# Patient Record
Sex: Female | Born: 1970
Health system: Southern US, Community
[De-identification: ages and names within clinical notes are randomized; demographics above are authoritative.]

## PROBLEM LIST (undated history)

## (undated) DIAGNOSIS — I10 Essential (primary) hypertension: Secondary | ICD-10-CM

## (undated) HISTORY — DX: Essential (primary) hypertension: I10

---

## 2005-05-27 HISTORY — PX: OTHER SURGICAL HISTORY: SHX169

## 2009-08-18 DIAGNOSIS — M25559 Pain in unspecified hip: Secondary | ICD-10-CM | POA: Insufficient documentation

## 2009-08-18 DIAGNOSIS — IMO0001 Reserved for inherently not codable concepts without codable children: Secondary | ICD-10-CM | POA: Insufficient documentation

## 2009-08-18 DIAGNOSIS — R002 Palpitations: Secondary | ICD-10-CM | POA: Insufficient documentation

## 2014-01-14 LAB — CBC AND DIFFERENTIAL: Hemoglobin: 12.4 g/dL (ref 12.0–16.0)

## 2014-01-14 LAB — LIPID PANEL
CHOLESTEROL: 202 mg/dL — AB (ref 0–200)
HDL: 46 mg/dL (ref 35–70)
LDL Cholesterol: 142 mg/dL
TRIGLYCERIDES: 70 mg/dL (ref 40–160)

## 2014-01-14 LAB — HM PAP SMEAR: HM PAP: NEGATIVE

## 2014-01-14 LAB — TSH: TSH: 0.9 u[IU]/mL (ref <=5.90)

## 2014-01-14 LAB — BASIC METABOLIC PANEL
BUN: 5 mg/dL (ref 4–21)
Creatinine: 0.9 mg/dL (ref <=1.1)

## 2015-01-09 ENCOUNTER — Encounter: Payer: Self-pay | Admitting: Internal Medicine

## 2015-01-09 DIAGNOSIS — F17200 Nicotine dependence, unspecified, uncomplicated: Secondary | ICD-10-CM | POA: Insufficient documentation

## 2015-01-09 DIAGNOSIS — J309 Allergic rhinitis, unspecified: Secondary | ICD-10-CM

## 2015-01-09 DIAGNOSIS — M51362 Other intervertebral disc degeneration, lumbar region with discogenic back pain and lower extremity pain: Secondary | ICD-10-CM | POA: Insufficient documentation

## 2015-01-09 DIAGNOSIS — Z720 Tobacco use: Secondary | ICD-10-CM

## 2015-01-09 DIAGNOSIS — M5136 Other intervertebral disc degeneration, lumbar region: Secondary | ICD-10-CM | POA: Insufficient documentation

## 2015-01-09 DIAGNOSIS — M19049 Primary osteoarthritis, unspecified hand: Secondary | ICD-10-CM

## 2015-01-09 DIAGNOSIS — I10 Essential (primary) hypertension: Secondary | ICD-10-CM | POA: Insufficient documentation

## 2015-01-31 ENCOUNTER — Encounter: Payer: Self-pay | Admitting: Internal Medicine

## 2015-02-01 ENCOUNTER — Other Ambulatory Visit: Payer: Self-pay

## 2015-02-01 MED ORDER — TRAMADOL-ACETAMINOPHEN 37.5-325 MG PO TABS: 1.0000 | ORAL_TABLET | Freq: Four times a day (QID) | ORAL | Status: DC | PRN

## 2015-04-11 ENCOUNTER — Other Ambulatory Visit: Payer: Self-pay | Admitting: Internal Medicine

## 2015-04-27 ENCOUNTER — Encounter: Payer: Self-pay | Admitting: Internal Medicine

## 2015-05-05 ENCOUNTER — Ambulatory Visit (INDEPENDENT_AMBULATORY_CARE_PROVIDER_SITE_OTHER): Payer: BLUE CROSS/BLUE SHIELD | Admitting: Internal Medicine

## 2015-05-05 ENCOUNTER — Encounter: Payer: Self-pay | Admitting: Internal Medicine

## 2015-05-05 VITALS — BP 148/88 | HR 84 | Ht 62.0 in | Wt 142.0 lb

## 2015-05-05 DIAGNOSIS — M6530 Trigger finger, unspecified finger: Secondary | ICD-10-CM

## 2015-05-05 DIAGNOSIS — N760 Acute vaginitis: Secondary | ICD-10-CM

## 2015-05-05 DIAGNOSIS — I10 Essential (primary) hypertension: Secondary | ICD-10-CM | POA: Diagnosis not present

## 2015-05-05 DIAGNOSIS — IMO0002 Reserved for concepts with insufficient information to code with codable children: Secondary | ICD-10-CM | POA: Insufficient documentation

## 2015-05-05 DIAGNOSIS — Z720 Tobacco use: Secondary | ICD-10-CM | POA: Diagnosis not present

## 2015-05-05 DIAGNOSIS — Z1239 Encounter for other screening for malignant neoplasm of breast: Secondary | ICD-10-CM | POA: Diagnosis not present

## 2015-05-05 DIAGNOSIS — Z124 Encounter for screening for malignant neoplasm of cervix: Secondary | ICD-10-CM | POA: Diagnosis not present

## 2015-05-05 DIAGNOSIS — M5136 Other intervertebral disc degeneration, lumbar region: Secondary | ICD-10-CM

## 2015-05-05 DIAGNOSIS — M65332 Trigger finger, left middle finger: Secondary | ICD-10-CM | POA: Insufficient documentation

## 2015-05-05 DIAGNOSIS — Z0000 Encounter for general adult medical examination without abnormal findings: Secondary | ICD-10-CM

## 2015-05-05 LAB — POCT URINALYSIS DIPSTICK
Bilirubin, UA: NEGATIVE
Blood, UA: NEGATIVE
Glucose, UA: NEGATIVE
Ketones, UA: NEGATIVE
Leukocytes, UA: NEGATIVE
Nitrite, UA: NEGATIVE
Protein, UA: NEGATIVE
Spec Grav, UA: 1.01
Urobilinogen, UA: 0.2
pH, UA: 5

## 2015-05-05 MED ORDER — VALSARTAN 160 MG PO TABS: 160.0000 mg | ORAL_TABLET | Freq: Every day | ORAL | Status: DC

## 2015-05-05 MED ORDER — CLINDAMYCIN HCL 300 MG PO CAPS: 300.0000 mg | ORAL_CAPSULE | Freq: Three times a day (TID) | ORAL | Status: DC

## 2015-05-05 NOTE — Patient Instructions (Addendum)
Breast Self-Awareness Practicing breast self-awareness may pick up problems early, prevent significant medical complications, and possibly save your life. By practicing breast self-awareness, you can become familiar with how your breasts look and feel and if your breasts are changing. This allows you to notice changes early. It can also offer you some reassurance that your breast health is good. One way to learn what is normal for your breasts and whether your breasts are changing is to do a breast self-exam. If you find a lump or something that was not present in the past, it is best to contact your caregiver right away. Other findings that should be evaluated by your caregiver include nipple discharge, especially if it is bloody; skin changes or reddening; areas where the skin seems to be pulled in (retracted); or new lumps and bumps. Breast pain is seldom associated with cancer (malignancy), but should also be evaluated by a caregiver. HOW TO PERFORM A BREAST SELF-EXAM The best time to examine your breasts is 5-7 days after your menstrual period is over. During menstruation, the breasts are lumpier, and it may be more difficult to pick up changes. If you do not menstruate, have reached menopause, or had your uterus removed (hysterectomy), you should examine your breasts at regular intervals, such as monthly. If you are breastfeeding, examine your breasts after a feeding or after using a breast pump. Breast implants do not decrease the risk for lumps or tumors, so continue to perform breast self-exams as recommended. Talk to your caregiver about how to determine the difference between the implant and breast tissue. Also, talk about the amount of pressure you should use during the exam. Over time, you will become more familiar with the variations of your breasts and more comfortable with the exam. A breast self-exam requires you to remove all your clothes above the waist.  Look at your breasts and nipples.  Stand in front of a mirror in a room with good lighting. With your hands on your hips, push your hands firmly downward. Look for a difference in shape, contour, and size from one breast to the other (asymmetry). Asymmetry includes puckers, dips, or bumps. Also, look for skin changes, such as reddened or scaly areas on the breasts. Look for nipple changes, such as discharge, dimpling, repositioning, or redness.  Carefully feel your breasts. This is best done either in the shower or tub while using soapy water or when flat on your back. Place the arm (on the side of the breast you are examining) above your head. Use the pads (not the fingertips) of your three middle fingers on your opposite hand to feel your breasts. Start in the underarm area and use  inch (2 cm) overlapping circles to feel your breast. Use 3 different levels of pressure (light, medium, and firm pressure) at each circle before moving to the next circle. The light pressure is needed to feel the tissue closest to the skin. The medium pressure will help to feel breast tissue a little deeper, while the firm pressure is needed to feel the tissue close to the ribs. Continue the overlapping circles, moving downward over the breast until you feel your ribs below your breast. Then, move one finger-width towards the center of the body. Continue to use the  inch (2 cm) overlapping circles to feel your breast as you move slowly up toward the collar bone (clavicle) near the base of the neck. Continue the up and down exam using all 3 pressures until you reach the   middle of the chest. Do this with each breast, carefully feeling for lumps or changes.   Keep a written record with breast changes or normal findings for each breast. By writing this information down, you do not need to depend only on memory for size, tenderness, or location. Write down where you are in your menstrual cycle, if you are still menstruating. Breast tissue can have some lumps or thick  tissue. However, see your caregiver if you find anything that concerns you.  SEEK MEDICAL CARE IF:  You see a change in shape, contour, or size of your breasts or nipples.   You see skin changes, such as reddened or scaly areas on the breasts or nipples.   You have an unusual discharge from your nipples.   You feel a new lump or unusually thick areas.    This information is not intended to replace advice given to you by your health care provider. Make sure you discuss any questions you have with your health care provider.   Document Released: 05/13/2005 Document Revised: 04/29/2012 Document Reviewed: 08/28/2011 Elsevier Interactive Patient Education 2016 [employer] Smoking Cessation, Tips for Success If you are ready to quit smoking, congratulations! You have chosen to help yourself be healthier. Cigarettes bring nicotine, tar, carbon monoxide, and other irritants into your body. Your lungs, heart, and blood vessels will be able to work better without these poisons. There are many different ways to quit smoking. Nicotine gum, nicotine patches, a nicotine inhaler, or nicotine nasal spray can help with physical craving. Hypnosis, support groups, and medicines help break the habit of smoking. WHAT THINGS CAN I DO TO MAKE QUITTING EASIER?  Here are some tips to help you quit for good:  Pick a date when you will quit smoking completely. Tell all of your friends and family about your plan to quit on that date.  Do not try to slowly cut down on the number of cigarettes you are smoking. Pick a quit date and quit smoking completely starting on that day.  Throw away all cigarettes.   Clean and remove all ashtrays from your home, work, and car.  On a card, write down your reasons for quitting. Carry the card with you and read it when you get the urge to smoke.  Cleanse your body of nicotine. Drink enough water and fluids to keep your urine clear or pale yellow. Do this after quitting to  flush the nicotine from your body.  Learn to predict your moods. Do not let a bad situation be your excuse to have a cigarette. Some situations in your life might tempt you into wanting a cigarette.  Never have "just one" cigarette. It leads to wanting another and another. Remind yourself of your decision to quit.  Change habits associated with smoking. If you smoked while driving or when feeling stressed, try other activities to replace smoking. Stand up when drinking your coffee. Brush your teeth after eating. Sit in a different chair when you read the paper. Avoid alcohol while trying to quit, and try to drink fewer caffeinated beverages. Alcohol and caffeine may urge you to smoke.  Avoid foods and drinks that can trigger a desire to smoke, such as sugary or spicy foods and alcohol.  Ask people who smoke not to smoke around you.  Have something planned to do right after eating or having a cup of coffee. For example, plan to take a walk or exercise.  Try a relaxation exercise to calm you down and   decrease your stress. Remember, you may be tense and nervous for the first 2 weeks after you quit, but this will pass.  Find new activities to keep your hands busy. Play with a pen, coin, or rubber band. Doodle or draw things on paper.  Brush your teeth right after eating. This will help cut down on the craving for the taste of tobacco after meals. You can also try mouthwash.   Use oral substitutes in place of cigarettes. Try using lemon drops, carrots, cinnamon sticks, or chewing gum. Keep them handy so they are available when you have the urge to smoke.  When you have the urge to smoke, try deep breathing.  Designate your home as a nonsmoking area.  If you are a heavy smoker, ask your health care provider about a prescription for nicotine chewing gum. It can ease your withdrawal from nicotine.  Reward yourself. Set aside the cigarette money you save and buy yourself something nice.  Look  for support from others. Join a support group or smoking cessation program. Ask someone at home or at work to help you with your plan to quit smoking.  Always ask yourself, "Do I need this cigarette or is this just a reflex?" Tell yourself, "Today, I choose not to smoke," or "I do not want to smoke." You are reminding yourself of your decision to quit.  Do not replace cigarette smoking with electronic cigarettes (commonly called e-cigarettes). The safety of e-cigarettes is unknown, and some may contain harmful chemicals.  If you relapse, do not give up! Plan ahead and think about what you will do the next time you get the urge to smoke. HOW WILL I FEEL WHEN I QUIT SMOKING? You may have symptoms of withdrawal because your body is used to nicotine (the addictive substance in cigarettes). You may crave cigarettes, be irritable, feel very hungry, cough often, get headaches, or have difficulty concentrating. The withdrawal symptoms are only temporary. They are strongest when you first quit but will go away within 10-14 days. When withdrawal symptoms occur, stay in control. Think about your reasons for quitting. Remind yourself that these are signs that your body is healing and getting used to being without cigarettes. Remember that withdrawal symptoms are easier to treat than the major diseases that smoking can cause.  Even after the withdrawal is over, expect periodic urges to smoke. However, these cravings are generally short lived and will go away whether you smoke or not. Do not smoke! WHAT RESOURCES ARE AVAILABLE TO HELP ME QUIT SMOKING? Your health care provider can direct you to community resources or hospitals for support, which may include:  Group support.  Education.  Hypnosis.  Therapy.   This information is not intended to replace advice given to you by your health care provider. Make sure you discuss any questions you have with your health care provider.   Document Released: 02/09/2004  Document Revised: 06/03/2014 Document Reviewed: 10/29/2012 Elsevier Interactive Patient Education 2016 Elsevier Inc.  

## 2015-05-05 NOTE — Progress Notes (Signed)
Date:  05/05/2015   Name:  April Fernandez Dattilo   DOB:  01/09/1971   MRN:  [id number]   Chief Complaint: Annual Exam and Hypertension April Fernandez is a 44 y.o. female who presents today for her Complete Annual Exam. She feels well. She reports exercising none. She reports she is sleeping well. She denies breast problems.  She is due for a mammogram.  Hypertension This is a chronic problem. The current episode started more than 1 year ago. The problem is unchanged. The problem is uncontrolled (not taking medications). Pertinent negatives include no chest pain, headaches, palpitations or shortness of breath. Past treatments include angiotensin blockers.   Hand pain - Patient complains of trigger fingers on both middle fingers. Started on the left hand and now is occurring in the right hand as well. It's uncomfortable and interferes with activities intermittently.  Chronic lumbar back pain - symptoms remain stable. She takes tramadol 4-6 per day. She no longer sees orthopedics due to lack of benefit from injections and physical therapy.  Review of Systems  Constitutional: Negative for fever, chills, diaphoresis and fatigue.  HENT: Negative for hearing loss, tinnitus, trouble swallowing and voice change.   Eyes: Negative for visual disturbance.  Respiratory: Negative for cough, chest tightness, shortness of breath and wheezing.   Cardiovascular: Negative for chest pain, palpitations and leg swelling.  Gastrointestinal: Negative for diarrhea, constipation and blood in stool.  Genitourinary: Negative for dysuria, frequency, hematuria and vaginal discharge (intermittent odor and vaginitis).  Musculoskeletal: Positive for back pain and arthralgias (trigger finger both hands). Negative for joint swelling and gait problem.  Neurological: Negative for dizziness, light-headedness, numbness and headaches.  Hematological: Negative for adenopathy. Does not bruise/bleed easily.  Psychiatric/Behavioral:  Negative for dysphoric mood. The patient is not nervous/anxious.     Patient Active Problem List   Diagnosis Date Noted  . Hypertension 01/09/2015  . Degenerative disc disease, lumbar 01/09/2015  . Allergic rhinitis 01/09/2015  . Arthritis of hand 01/09/2015  . Current tobacco use 01/09/2015  . Allergic reaction, urticaria 08/18/2009  . Arthralgia of hip or thigh 08/18/2009  . Awareness of heartbeats 08/18/2009    Prior to Admission medications   Medication Sig Start Date End Date Taking? Authorizing Provider  [place] SouthFALMINA 0.1-20 MG-MCG tablet 1 Tablet(s) Tablet(s), Oral, daily 04/11/15  Yes Reubin MilanLaura H Cristel Rail, MD  traMADol-acetaminophen (ULTRACET) 37.5-325 MG per tablet Take 1-2 tablets by mouth every 6 (six) hours as needed. 02/01/15  Yes Reubin MilanLaura H Lloyde Ludlam, MD  valsartan (DIOVAN) 160 MG tablet Take 160 mg by mouth daily.    Historical Provider, MD    No Known Allergies  Past Surgical History  Procedure Laterality Date  . Lumbar esi  2007    Social History  Substance Use Topics  . Smoking status: Current Every Day Smoker  . Smokeless tobacco: None  . Alcohol Use: No    Medication list has been reviewed and updated.   Physical Exam  Constitutional: She is oriented to person, place, and time. She appears well-developed and well-nourished. No distress.  HENT:  Head: Normocephalic and atraumatic.  Right Ear: Tympanic membrane and ear canal normal.  Left Ear: Tympanic membrane and ear canal normal.  Nose: Right sinus exhibits no maxillary sinus tenderness. Left sinus exhibits no maxillary sinus tenderness.  Mouth/Throat: Uvula is midline and oropharynx is clear and moist.  Eyes: Conjunctivae and EOM are normal. Right eye exhibits no discharge. Left eye exhibits no discharge. No scleral icterus.  Neck: Normal range of motion. Carotid  bruit is not present. No erythema present. No thyromegaly present.  Cardiovascular: Normal rate, regular rhythm, normal heart sounds and normal pulses.    Pulmonary/Chest: Effort normal. No respiratory distress. She has no wheezes. Right breast exhibits no mass, no nipple discharge, no skin change and no tenderness. Left breast exhibits no mass, no nipple discharge, no skin change and no tenderness.  Abdominal: Soft. Bowel sounds are normal. There is no hepatosplenomegaly. There is no tenderness. There is no CVA tenderness.  Genitourinary: Rectum normal, vagina normal and uterus normal. No breast swelling, tenderness, discharge or bleeding. There is no tenderness, lesion or injury on the right labia. There is no tenderness, lesion or injury on the left labia. Cervix exhibits friability. Cervix exhibits no motion tenderness and no discharge. Right adnexum displays no mass, no tenderness and no fullness. Left adnexum displays no mass, no tenderness and no fullness. No erythema or tenderness in the vagina. No vaginal discharge found.  Musculoskeletal: Normal range of motion.  Triggering of both middle fingers - mild, with mild palmar tenderness.  Lymphadenopathy:    She has no cervical adenopathy.    She has no axillary adenopathy.  Neurological: She is alert and oriented to person, place, and time. She has normal reflexes. No cranial nerve deficit or sensory deficit.  Skin: Skin is warm, dry and intact. No rash noted.  Psychiatric: She has a normal mood and affect. Her speech is normal and behavior is normal. Thought content normal.  Nursing note and vitals reviewed.   BP 148/88 mmHg  Pulse 84  Ht  (1.575 m)  Wt 142 lb (64.411 kg)  BMI 25.97 kg/m2  Assessment and Plan: 1. Annual physical exam - POCT urinalysis dipstick - Lipid panel  2. Essential hypertension Resume medication Follow-up in 3 months - CBC with Differential/Platelet - Comprehensive metabolic panel - TSH - valsartan (DIOVAN) 160 MG tablet; Take 1 tablet (160 mg total) by mouth daily.  Dispense: 30 tablet; Refill: 5  3. Current tobacco use Suggestions a help quit  smoking or provided  4. Breast cancer screening Schedule mammogram at [facility] diagnostic imaging  5. Vaginitis Recurrent symptoms - clindamycin (CLEOCIN) 300 MG capsule; Take 1 capsule (300 mg total) by mouth 3 (three) times daily.  Dispense: 30 capsule; Refill: 1  6. Cervical cancer screening - PAP IG, CT-NG, RFX HPV ALL  7. Degenerative disc disease, lumbar Stable symptoms on tramadol  8. Trigger finger of both hands - Ambulatory referral to Orthopedic Surgery   Bari Edward, MD [facility]  05/05/2015

## 2015-05-06 LAB — LIPID PANEL
CHOL/HDL RATIO: 4.6 ratio — AB (ref 0.0–4.4)
CHOLESTEROL TOTAL: 214 mg/dL — AB (ref 100–199)
HDL: 47 mg/dL (ref >39)
LDL Calculated: 153 mg/dL — ABNORMAL HIGH (ref 0–99)
TRIGLYCERIDES: 72 mg/dL (ref 0–149)
VLDL Cholesterol Cal: 14 mg/dL (ref 5–40)

## 2015-05-06 LAB — CBC WITH DIFFERENTIAL/PLATELET
BASOS: 0 %
Basophils Absolute: 0 10*3/uL (ref 0.0–0.2)
EOS (ABSOLUTE): 0.1 10*3/uL (ref 0.0–0.4)
Eos: 2 %
HEMATOCRIT: 37.9 % (ref 34.0–46.6)
Hemoglobin: 12.4 g/dL (ref 11.1–15.9)
IMMATURE GRANULOCYTES: 0 %
Immature Grans (Abs): 0 10*3/uL (ref 0.0–0.1)
LYMPHS ABS: 2.6 10*3/uL (ref 0.7–3.1)
LYMPHS: 52 %
MCH: 29.7 pg (ref 26.6–33.0)
MCHC: 32.7 g/dL (ref 31.5–35.7)
MCV: 91 fL (ref 79–97)
MONOS ABS: 0.3 10*3/uL (ref 0.1–0.9)
Monocytes: 6 %
NEUTROS PCT: 40 %
Neutrophils Absolute: 2 10*3/uL (ref 1.4–7.0)
PLATELETS: 291 10*3/uL (ref 150–379)
RBC: 4.17 x10E6/uL (ref 3.77–5.28)
RDW: 13.2 % (ref 12.3–15.4)
WBC: 4.9 10*3/uL (ref 3.4–10.8)

## 2015-05-06 LAB — COMPREHENSIVE METABOLIC PANEL
A/G RATIO: 1.6 (ref 1.1–2.5)
ALK PHOS: 48 IU/L (ref 39–117)
ALT: 7 IU/L (ref 0–32)
AST: 12 IU/L (ref 0–40)
Albumin: 4.4 g/dL (ref 3.5–5.5)
BILIRUBIN TOTAL: 0.4 mg/dL (ref 0.0–1.2)
BUN/Creatinine Ratio: 8 — ABNORMAL LOW (ref 9–23)
BUN: 7 mg/dL (ref 6–24)
CALCIUM: 9.5 mg/dL (ref 8.7–10.2)
CHLORIDE: 99 mmol/L (ref 97–106)
CO2: 24 mmol/L (ref 18–29)
Creatinine, Ser: 0.91 mg/dL (ref 0.57–1.00)
GFR calc Af Amer: 88 mL/min/{1.73_m2} (ref >59)
GFR calc non Af Amer: 76 mL/min/{1.73_m2} (ref >59)
GLOBULIN, TOTAL: 2.8 g/dL (ref 1.5–4.5)
Glucose: 86 mg/dL (ref 65–99)
POTASSIUM: 4.4 mmol/L (ref 3.5–5.2)
SODIUM: 137 mmol/L (ref 136–144)
Total Protein: 7.2 g/dL (ref 6.0–8.5)

## 2015-05-06 LAB — TSH: TSH: 1.47 u[IU]/mL (ref 0.450–4.500)

## 2015-05-08 ENCOUNTER — Other Ambulatory Visit: Payer: Self-pay | Admitting: Internal Medicine

## 2015-05-09 LAB — PAP IG, CT-NG, RFX HPV ALL: PAP Smear Comment: 0

## 2015-08-04 ENCOUNTER — Ambulatory Visit: Payer: BLUE CROSS/BLUE SHIELD | Admitting: Internal Medicine

## 2015-09-26 ENCOUNTER — Other Ambulatory Visit: Payer: Self-pay | Admitting: Internal Medicine

## 2015-09-27 ENCOUNTER — Other Ambulatory Visit: Payer: Self-pay | Admitting: Internal Medicine

## 2015-09-27 MED ORDER — TRAMADOL-ACETAMINOPHEN 37.5-325 MG PO TABS: ORAL_TABLET | ORAL | Status: DC

## 2015-09-27 NOTE — Telephone Encounter (Signed)
pts coming in on 5/12 for f/u and said she is out of her tramadol and is asking if a refill can be send to [facility] in [place]

## 2015-10-06 ENCOUNTER — Encounter: Payer: Self-pay | Admitting: Internal Medicine

## 2015-10-06 ENCOUNTER — Ambulatory Visit (INDEPENDENT_AMBULATORY_CARE_PROVIDER_SITE_OTHER): Payer: BLUE CROSS/BLUE SHIELD | Admitting: Internal Medicine

## 2015-10-06 VITALS — BP 118/82 | HR 74 | Resp 16 | Ht 62.0 in | Wt 146.0 lb

## 2015-10-06 DIAGNOSIS — M5136 Other intervertebral disc degeneration, lumbar region: Secondary | ICD-10-CM | POA: Diagnosis not present

## 2015-10-06 DIAGNOSIS — Z1239 Encounter for other screening for malignant neoplasm of breast: Secondary | ICD-10-CM | POA: Diagnosis not present

## 2015-10-06 DIAGNOSIS — I10 Essential (primary) hypertension: Secondary | ICD-10-CM

## 2015-10-06 DIAGNOSIS — M51369 Other intervertebral disc degeneration, lumbar region without mention of lumbar back pain or lower extremity pain: Secondary | ICD-10-CM

## 2015-10-06 DIAGNOSIS — Z720 Tobacco use: Secondary | ICD-10-CM | POA: Diagnosis not present

## 2015-10-06 MED ORDER — TRAMADOL-ACETAMINOPHEN 37.5-325 MG PO TABS: ORAL_TABLET | ORAL | Status: DC

## 2015-10-06 MED ORDER — VALSARTAN 160 MG PO TABS: 160.0000 mg | ORAL_TABLET | Freq: Every day | ORAL | Status: DC

## 2015-10-06 NOTE — Patient Instructions (Signed)
DASH Eating Plan  DASH stands for "Dietary Approaches to Stop Hypertension." The DASH eating plan is a healthy eating plan that has been shown to reduce high blood pressure (hypertension). Additional health benefits may include reducing the risk of type 2 diabetes mellitus, heart disease, and stroke. The DASH eating plan may also help with weight loss.  WHAT DO I NEED TO KNOW ABOUT THE DASH EATING PLAN?  For the DASH eating plan, you will follow these general guidelines:  · Choose foods with a percent daily value for sodium of less than 5% (as listed on the food label).  · Use salt-free seasonings or herbs instead of table salt or sea salt.  · Check with your health care provider or pharmacist before using salt substitutes.  · Eat lower-sodium products, often labeled as "lower sodium" or "no salt added."  · Eat fresh foods.  · Eat more vegetables, fruits, and low-fat dairy products.  · Choose whole grains. Look for the word "whole" as the first word in the ingredient list.  · Choose fish and skinless chicken or turkey more often than red meat. Limit fish, poultry, and meat to 6 oz (170 g) each day.  · Limit sweets, desserts, sugars, and sugary drinks.  · Choose heart-healthy fats.  · Limit cheese to 1 oz (28 g) per day.  · Eat more home-cooked food and less restaurant, buffet, and fast food.  · Limit fried foods.  · Cook foods using methods other than frying.  · Limit canned vegetables. If you do use them, rinse them well to decrease the sodium.  · When eating at a restaurant, ask that your food be prepared with less salt, or no salt if possible.  WHAT FOODS CAN I EAT?  Seek help from a dietitian for individual calorie needs.  Grains  Whole grain or whole wheat bread. Brown rice. Whole grain or whole wheat pasta. Quinoa, bulgur, and whole grain cereals. Low-sodium cereals. Corn or whole wheat flour tortillas. Whole grain cornbread. Whole grain crackers. Low-sodium crackers.  Vegetables  Fresh or frozen vegetables  (raw, steamed, roasted, or grilled). Low-sodium or reduced-sodium tomato and vegetable juices. Low-sodium or reduced-sodium tomato sauce and paste. Low-sodium or reduced-sodium canned vegetables.   Fruits  All fresh, canned (in natural juice), or frozen fruits.  Meat and Other Protein Products  Ground beef (85% or leaner), grass-fed beef, or beef trimmed of fat. Skinless chicken or turkey. Ground chicken or turkey. Pork trimmed of fat. All fish and seafood. Eggs. Dried beans, peas, or lentils. Unsalted nuts and seeds. Unsalted canned beans.  Dairy  Low-fat dairy products, such as skim or 1% milk, 2% or reduced-fat cheeses, low-fat ricotta or cottage cheese, or plain low-fat yogurt. Low-sodium or reduced-sodium cheeses.  Fats and Oils  Tub margarines without trans fats. Light or reduced-fat mayonnaise and salad dressings (reduced sodium). Avocado. Safflower, olive, or canola oils. Natural peanut or almond butter.  Other  Unsalted popcorn and pretzels.  The items listed above may not be a complete list of recommended foods or beverages. Contact your dietitian for more options.  WHAT FOODS ARE NOT RECOMMENDED?  Grains  White bread. White pasta. White rice. Refined cornbread. Bagels and croissants. Crackers that contain trans fat.  Vegetables  Creamed or fried vegetables. Vegetables in a cheese sauce. Regular canned vegetables. Regular canned tomato sauce and paste. Regular tomato and vegetable juices.  Fruits  Dried fruits. Canned fruit in light or heavy syrup. Fruit juice.  Meat and Other Protein   Products  Fatty cuts of meat. Ribs, chicken wings, bacon, sausage, bologna, salami, chitterlings, fatback, hot dogs, bratwurst, and packaged luncheon meats. Salted nuts and seeds. Canned beans with salt.  Dairy  Whole or 2% milk, cream, half-and-half, and cream cheese. Whole-fat or sweetened yogurt. Full-fat cheeses or blue cheese. Nondairy creamers and whipped toppings. Processed cheese, cheese spreads, or cheese  curds.  Condiments  Onion and garlic salt, seasoned salt, table salt, and sea salt. Canned and packaged gravies. Worcestershire sauce. Tartar sauce. Barbecue sauce. Teriyaki sauce. Soy sauce, including reduced sodium. Steak sauce. Fish sauce. Oyster sauce. Cocktail sauce. Horseradish. Ketchup and mustard. Meat flavorings and tenderizers. Bouillon cubes. Hot sauce. Tabasco sauce. Marinades. Taco seasonings. Relishes.  Fats and Oils  Butter, stick margarine, lard, shortening, ghee, and bacon fat. Coconut, palm kernel, or palm oils. Regular salad dressings.  Other  Pickles and olives. Salted popcorn and pretzels.  The items listed above may not be a complete list of foods and beverages to avoid. Contact your dietitian for more information.  WHERE CAN I FIND MORE INFORMATION?  National Heart, [employer], [employer] Institute: www.nhlbi.nih.gov/health/health-topics/topics/dash/     This information is not intended to replace advice given to you by your health care provider. Make sure you discuss any questions you have with your health care provider.     Document Released: 05/02/2011 Document Revised: 06/03/2014 Document Reviewed: 03/17/2013  Elsevier Interactive Patient Education ©2016 Elsevier Inc.

## 2015-10-06 NOTE — Progress Notes (Signed)
Date:  10/06/2015   Name:  April Fernandez   DOB:  December 14, 1970   MRN:  [id number]   Chief Complaint: Hypertension Hypertension This is a chronic problem. The current episode started more than 1 year ago. The problem is unchanged. The problem is controlled. Pertinent negatives include no blurred vision, chest pain, headaches, palpitations, peripheral edema, shortness of breath or sweats. Past treatments include angiotensin blockers. The current treatment provides significant improvement.  Back Pain This is a chronic problem. The current episode started more than 1 year ago. The problem is unchanged. The pain is present in the lumbar spine and sacro-iliac. Pertinent negatives include no chest pain, fever or headaches.    Review of Systems  Constitutional: Positive for unexpected weight change (gained 10 lbs in the past year). Negative for fever, chills and fatigue.  Eyes: Positive for visual disturbance. Negative for blurred vision.  Respiratory: Negative for cough, chest tightness, shortness of breath and wheezing.   Cardiovascular: Negative for chest pain, palpitations and leg swelling.  Musculoskeletal: Positive for back pain.  Neurological: Negative for syncope and headaches.  Psychiatric/Behavioral: Negative for sleep disturbance and dysphoric mood.    Patient Active Problem List   Diagnosis Date Noted  . Trigger finger of both hands 05/05/2015  . Hypertension 01/09/2015  . Degenerative disc disease, lumbar 01/09/2015  . Allergic rhinitis 01/09/2015  . Arthritis of hand 01/09/2015  . Current tobacco use 01/09/2015  . Allergic reaction, urticaria 08/18/2009  . Arthralgia of hip or thigh 08/18/2009  . Awareness of heartbeats 08/18/2009    Prior to Admission medications   Medication Sig Start Date End Date Taking? Authorizing Provider  traMADol-acetaminophen (ULTRACET) 37.5-325 MG tablet take 1 to 2 tablets by mouth every 6 hours if needed 09/27/15  Yes Reubin MilanLaura H Kerrianne Jeng, MD    valsartan (DIOVAN) 160 MG tablet Take 1 tablet (160 mg total) by mouth daily. 05/05/15  Yes Reubin MilanLaura H Hoorain Kozakiewicz, MD    No Known Allergies  Past Surgical History  Procedure Laterality Date  . Lumbar esi  2007    Social History  Substance Use Topics  . Smoking status: Current Every Day Smoker -- 0.50 packs/day    Types: Cigarettes  . Smokeless tobacco: None  . Alcohol Use: No   Medication list has been reviewed and updated.  Physical Exam  Constitutional: She is oriented to person, place, and time. She appears well-developed. No distress.  HENT:  Head: Normocephalic and atraumatic.  Neck: Normal range of motion. Neck supple. Carotid bruit is not present.  Cardiovascular: Normal rate, regular rhythm and normal heart sounds.   No murmur heard. Pulmonary/Chest: Effort normal and breath sounds normal. No respiratory distress. She has no wheezes. She has no rales.  Lymphadenopathy:    She has no cervical adenopathy.  Neurological: She is alert and oriented to person, place, and time.  Reflex Scores:      Patellar reflexes are 3+ on the right side and 3+ on the left side. Skin: Skin is warm and dry. No rash noted.  Psychiatric: She has a normal mood and affect. Her behavior is normal. Thought content normal.  Nursing note and vitals reviewed.   BP 118/82 mmHg  Pulse 74  Resp 16  Ht 5\' 2"  (1.575 m)  Wt 146 lb (66.225 kg)  BMI 26.70 kg/m2  SpO2 99%  LMP 09/29/2015  Assessment and Plan: 1. Essential hypertension Well controlled - continue medication Recommend regular exercise to help control weight - valsartan (DIOVAN) 160 MG  tablet; Take 1 tablet (160 mg total) by mouth daily.  Dispense: 30 tablet; Refill: 5  2. Degenerative disc disease, lumbar Chronic, stable - tramadol refilled  3. Current tobacco use Pt strongly urged to work on quitting  4. Breast cancer screening Order sent to [facility]   Bari Edward, MD [facility]  10/06/2015

## 2016-04-09 ENCOUNTER — Other Ambulatory Visit: Payer: Self-pay | Admitting: Internal Medicine

## 2016-04-16 ENCOUNTER — Other Ambulatory Visit: Payer: Self-pay | Admitting: Internal Medicine

## 2016-05-08 ENCOUNTER — Encounter: Payer: BLUE CROSS/BLUE SHIELD | Admitting: Internal Medicine

## 2016-05-10 ENCOUNTER — Other Ambulatory Visit: Payer: Self-pay | Admitting: Internal Medicine

## 2016-06-05 ENCOUNTER — Telehealth: Payer: Self-pay | Admitting: Internal Medicine

## 2016-06-05 ENCOUNTER — Telehealth: Payer: Self-pay | Admitting: *Deleted

## 2016-06-05 NOTE — Telephone Encounter (Signed)
I sent her a year supply of birth control on 05/13/16.  She should not need refills yet.

## 2016-06-05 NOTE — Telephone Encounter (Signed)
Patient called and needs a refill of her birth control medicine . Patient had to r/s her CPE appt with Dr. Judithann GravesBerglund.  She will need this RX by Sunday. Her pharmacy is [facility] in [place]. # [phone]. Patient states you can call her if you have any questions [phone]. Please advise. Thank you

## 2016-06-05 NOTE — Telephone Encounter (Signed)
Called pt and left VM message to let her know Dr. Judithann GravesBerglund refill her birth control medication for 1 year supply 05-13-2016. If any question please call the office back..Marland Kitchen

## 2016-06-06 ENCOUNTER — Encounter: Payer: BLUE CROSS/BLUE SHIELD | Admitting: Internal Medicine

## 2016-07-09 ENCOUNTER — Ambulatory Visit
Admission: RE | Admit: 2016-07-09 | Discharge: 2016-07-09 | Disposition: A | Discharge status: Home / Self Care | Payer: BLUE CROSS/BLUE SHIELD | Source: Ambulatory Visit | Attending: Internal Medicine | Admitting: Internal Medicine

## 2016-07-09 ENCOUNTER — Encounter: Payer: Self-pay | Admitting: Internal Medicine

## 2016-07-09 ENCOUNTER — Ambulatory Visit (INDEPENDENT_AMBULATORY_CARE_PROVIDER_SITE_OTHER): Payer: BLUE CROSS/BLUE SHIELD | Admitting: Internal Medicine

## 2016-07-09 VITALS — BP 150/88 | HR 86 | Temp 98.2°F | Ht 62.0 in | Wt 142.0 lb

## 2016-07-09 DIAGNOSIS — R091 Pleurisy: Secondary | ICD-10-CM | POA: Diagnosis not present

## 2016-07-09 DIAGNOSIS — I10 Essential (primary) hypertension: Secondary | ICD-10-CM | POA: Diagnosis not present

## 2016-07-09 DIAGNOSIS — R079 Chest pain, unspecified: Secondary | ICD-10-CM | POA: Diagnosis not present

## 2016-07-09 DIAGNOSIS — M419 Scoliosis, unspecified: Secondary | ICD-10-CM | POA: Insufficient documentation

## 2016-07-09 DIAGNOSIS — R0602 Shortness of breath: Secondary | ICD-10-CM | POA: Diagnosis not present

## 2016-07-09 MED ORDER — PREDNISONE 10 MG PO TABS: ORAL_TABLET | ORAL | 0 refills | Status: DC

## 2016-07-09 MED ORDER — VALSARTAN 160 MG PO TABS: 160.0000 mg | ORAL_TABLET | Freq: Every day | ORAL | 5 refills | Status: DC

## 2016-07-09 NOTE — Patient Instructions (Signed)
Pleurisy Pleurisy, also called pleuritis, is irritation and swelling (inflammation) of the linings of the lungs. The linings of the lungs are called pleura. They cover the outside of the lungs and the inside of the chest wall. There is a small amount of fluid (pleural fluid) between the pleura that allows the lungs to move in and out smoothly when you breathe. Pleurisy causes the pleura to be rough and dry and to rub together when you breathe, which is painful. In some cases, pleurisy can cause pleural fluid to build up between the pleura (pleural effusion). What are the causes? Common causes of this condition include:  A lung infection caused by bacteria or a virus.  A blood clot that travels to the lung (pulmonary embolism).  Air leaking into the pleural space (pneumothorax).  Lung cancer or a lung tumor.  A chest injury.  Diseases that can cause lung inflammation. These include rheumatoid arthritis, lupus, sickle cell disease, inflammatory bowel disease, and pancreatitis.  Heart or chest surgery.  Lung damage from inhaling asbestos.  A lung reaction to certain medicines.  Sometimes the cause is unknown. What are the signs or symptoms? Chest pain is the main symptom of this condition. The pain is usually on one side. Chest pain may start suddenly and be sharp or stabbing. It may become a constant dull ache. You may also feel pain in your back or shoulder. The pain may get worse when you cough, take deep breaths, or make sudden movements. Other symptoms may include:  Shortness of breath.  Noisy breathing (wheezing).  Cough.  Chills.  Fever.  How is this diagnosed? This condition may be diagnosed based on:  Your medical history.  Your symptoms.  A physical exam. Your health care provider will listen to your breathing with a stethoscope to check for a rough, rubbing sound (friction rub). If you have pleural effusion, your breathing sounds may be muffled.  Tests, such  as: ? Blood tests to check for infections or diseases and to measure the oxygen in your blood. ? Imaging studies of your lungs. These may include a chest X-ray, ultrasound, MRI, or CT scan. ? A procedure to remove pleural fluid with a needle for testing (thoracentesis).  How is this treated? Treatment for this condition depends on the cause. Pleurisy that was caused by a virus usually clears up within 2 weeks. Treatment for pleurisy may include:  NSAIDs to help relieve pain and swelling.  Antibiotic medicines, if your condition was caused by a bacterial infection.  Prescription pain or cough medicine.  Medicines to dissolve a blood clot, if your condition was caused by pulmonary embolism.  Removal of pleural fluid or air.  Follow these instructions at home: Medicines  Take over-the-counter and prescription medicines only as told by your health care provider.  If you were prescribed an antibiotic, take it as told by your health care provider. Do not stop taking the antibiotic even if you start to feel better. Activity  Rest and return to your normal activities as told by your health care provider. Ask your health care provider what activities are safe for you.  Do not drive or use heavy machinery while taking prescription pain medicine. General instructions  Monitor your pleurisy for any changes.  Take deep breaths often, even if it is painful. This can help prevent lung infection (pneumonia) and collapse of lung tissue (atelectasis).  When lying down, lie on your painful side. This may reduce pain.  Do not smoke. If   you need help quitting, ask your health care provider.  Keep all follow-up visits as told by your health care provider. This is important. Contact a health care provider if:  You have pain that: ? Gets worse. ? Does not get better with medicine. ? Lasts for more than 1 week.  You have a fever or chills.  Your cough or shortness of breath is not improving  at home.  You cough up pus-like (purulent) secretions. Get help right away if:  Your lips, fingernails, or toenails darken or turn blue.  You cough up blood.  You have any of the following symptoms that get worse: ? Difficulty breathing. ? Shortness of breath. ? Wheezing.  You have pain that spreads into your neck, arms, or jaw.  You develop a rash.  You vomit.  You faint. Summary  Pleurisy is inflammation of the linings of the lungs (pleura).  Pleurisy causes pain that makes it difficult for you to breathe or cough.  Pleurisy is often caused by an underlying infection or disease.  Treatment of pleurisy depends on the cause, and it often includes medicines. This information is not intended to replace advice given to you by your health care provider. Make sure you discuss any questions you have with your health care provider. Document Released: 05/13/2005 Document Revised: 02/05/2016 Document Reviewed: 02/05/2016 Elsevier Interactive Patient Education  2017 [employer]vier Inc.  

## 2016-07-09 NOTE — Progress Notes (Signed)
Date:  07/09/2016   Name:  April Fernandez   DOB:  06/03/1970   MRN:  [id number]   Chief Complaint: Breast Pain ( pt stated Lt breast is painful/sharp pain when breathing going up the neck for 3 days) Chest Pain   This is a new problem. The current episode started in the past 7 days. The onset quality is sudden. The problem occurs constantly. The problem has been unchanged. The pain is moderate. The quality of the pain is described as heavy and dull. The pain radiates to the left shoulder. Associated symptoms include shortness of breath. Pertinent negatives include no abdominal pain, cough, fever, lower extremity edema or palpitations. It is unknown (for several weeks but resolved without treatment 2 weeks ago) what precipitates the cough. The cough is non-productive. The pain is aggravated by breathing.  She continues to smoke about 1/2 ppd.  No sputum or hemoptysis.  Review of Systems  Constitutional: Negative for chills, fatigue, fever and unexpected weight change.  Respiratory: Positive for shortness of breath. Negative for cough, choking, chest tightness and wheezing.   Cardiovascular: Positive for chest pain. Negative for palpitations.       Chest wall pain  Gastrointestinal: Negative for abdominal pain.    Patient Active Problem List   Diagnosis Date Noted  . Trigger finger of both hands 05/05/2015  . Hypertension 01/09/2015  . Degenerative disc disease, lumbar 01/09/2015  . Allergic rhinitis 01/09/2015  . Arthritis of hand 01/09/2015  . Current tobacco use 01/09/2015  . Allergic reaction, urticaria 08/18/2009  . Arthralgia of hip or thigh 08/18/2009  . Awareness of heartbeats 08/18/2009    Prior to Admission medications   Medication Sig Start Date End Date Taking? Authorizing Provider  LARISSIA 0.1-20 MG-MCG tablet take 1 tablet by mouth once daily 05/13/16  Yes Reubin Milan, MD  traMADol-acetaminophen [facility]) 37.5-325 MG tablet take 1 to 2 tablets by mouth every  6 hours if needed 04/16/16  Yes Reubin Milan, MD  valsartan (DIOVAN) 160 MG tablet Take 1 tablet (160 mg total) by mouth daily. 10/06/15  Yes Reubin Milan, MD    No Known Allergies  Past Surgical History:  Procedure Laterality Date  . lumbar ESI  2007    Social History  Substance Use Topics  . Smoking status: Current Every Day Smoker    Packs/day: 0.50    Types: Cigarettes  . Smokeless tobacco: Current User  . Alcohol use No     Medication list has been reviewed and updated.   Physical Exam  Constitutional: She is oriented to person, place, and time. She appears well-developed. No distress.  HENT:  Head: Normocephalic and atraumatic.  Neck: Normal range of motion. Neck supple.  Cardiovascular: Normal rate, regular rhythm and normal heart sounds.   Pulmonary/Chest: Effort normal and breath sounds normal. No respiratory distress. She has no wheezes. She has no rales.  Musculoskeletal: Normal range of motion.  Lymphadenopathy:    She has no cervical adenopathy.  Neurological: She is alert and oriented to person, place, and time.  Skin: Skin is warm and dry. No rash noted.  Psychiatric: She has a normal mood and affect. Her behavior is normal. Thought content normal.  Nursing note and vitals reviewed.   BP (!) 150/88   Pulse 86   Temp 98.2 F (36.8 C)   Ht 5\' 2"  (1.575 m)   Wt 142 lb (64.4 kg)   SpO2 98%   BMI 25.97 kg/m   Assessment  and Plan: 1. Pleurisy - DG Chest 2 View; Future - predniSONE (DELTASONE) 10 MG tablet; Take 6 on day 1, 5 on day 2, 4 on day 3, 3 on day 4, 2 on day 5 and 1 on day 1 then stop.  Dispense: 21 tablet; Refill: 0  2. Essential hypertension Fair control - will adjust next visit if needed - valsartan (DIOVAN) 160 MG tablet; Take 1 tablet (160 mg total) by mouth daily.  Dispense: 30 tablet; Refill: 5   Bari EdwardLaura Everett Ricciardelli, MD [facility]  07/09/2016

## 2016-07-15 NOTE — Telephone Encounter (Signed)
error 

## 2016-10-08 ENCOUNTER — Ambulatory Visit (INDEPENDENT_AMBULATORY_CARE_PROVIDER_SITE_OTHER): Payer: BLUE CROSS/BLUE SHIELD | Admitting: Internal Medicine

## 2016-10-08 ENCOUNTER — Encounter: Payer: Self-pay | Admitting: Internal Medicine

## 2016-10-08 ENCOUNTER — Other Ambulatory Visit: Payer: Self-pay | Admitting: Internal Medicine

## 2016-10-08 VITALS — BP 129/80 | HR 68 | Ht 62.0 in | Wt 145.6 lb

## 2016-10-08 DIAGNOSIS — Z1231 Encounter for screening mammogram for malignant neoplasm of breast: Secondary | ICD-10-CM

## 2016-10-08 DIAGNOSIS — N912 Amenorrhea, unspecified: Secondary | ICD-10-CM | POA: Diagnosis not present

## 2016-10-08 DIAGNOSIS — M5136 Other intervertebral disc degeneration, lumbar region: Secondary | ICD-10-CM

## 2016-10-08 DIAGNOSIS — Z720 Tobacco use: Secondary | ICD-10-CM | POA: Diagnosis not present

## 2016-10-08 DIAGNOSIS — Z0001 Encounter for general adult medical examination with abnormal findings: Secondary | ICD-10-CM

## 2016-10-08 DIAGNOSIS — I10 Essential (primary) hypertension: Secondary | ICD-10-CM | POA: Diagnosis not present

## 2016-10-08 DIAGNOSIS — G4730 Sleep apnea, unspecified: Secondary | ICD-10-CM

## 2016-10-08 DIAGNOSIS — Z0000 Encounter for general adult medical examination without abnormal findings: Secondary | ICD-10-CM

## 2016-10-08 DIAGNOSIS — N761 Subacute and chronic vaginitis: Secondary | ICD-10-CM | POA: Diagnosis not present

## 2016-10-08 DIAGNOSIS — Z1239 Encounter for other screening for malignant neoplasm of breast: Secondary | ICD-10-CM

## 2016-10-08 LAB — POCT URINALYSIS DIPSTICK
Bilirubin, UA: NEGATIVE
GLUCOSE UA: NEGATIVE
KETONES UA: NEGATIVE
LEUKOCYTES UA: NEGATIVE
Nitrite, UA: NEGATIVE
Protein, UA: NEGATIVE
SPEC GRAV UA: 1.01 (ref 1.010–1.025)
UROBILINOGEN UA: 0.2 U/dL
pH, UA: 5 (ref 5.0–8.0)

## 2016-10-08 LAB — POCT URINE PREGNANCY: PREG TEST UR: NEGATIVE

## 2016-10-08 MED ORDER — CLINDAMYCIN HCL 300 MG PO CAPS: 300.0000 mg | ORAL_CAPSULE | Freq: Three times a day (TID) | ORAL | 1 refills | Status: DC

## 2016-10-08 MED ORDER — FLUCONAZOLE 100 MG PO TABS
100.0000 mg | ORAL_TABLET | Freq: Every day | ORAL | 0 refills | Status: DC
Start: 2016-10-08 — End: 2017-07-16

## 2016-10-08 MED ORDER — TRAMADOL-ACETAMINOPHEN 37.5-325 MG PO TABS: ORAL_TABLET | ORAL | 5 refills | Status: DC

## 2016-10-08 NOTE — Progress Notes (Signed)
Date:  10/08/2016   Name:  April Fernandez   DOB:  10-23-1970   MRN:  [id number]   Chief Complaint: Annual Exam (Breast Exam. Pt wants Pap Smear- states she does them annually. ) April Fernandez is a 46 y.o. female who presents today for her Complete Annual Exam. She feels well. She reports exercising sometimes. She reports she is sleeping well. She is due for a mammogram - denies breast complaints.  Last pap 04/2015 was normal.  Hypertension  This is a chronic problem. The problem is controlled. Pertinent negatives include no chest pain, headaches, palpitations or shortness of breath. Past treatments include angiotensin blockers. The current treatment provides significant improvement.   Misses menses - 20 days late on cycle.  Still taking OCPs.  Uses condoms most of the time as well.  Has not done a home pregnancy.  No hot flashes or sweats either.   Fatigue - she remains tired despite sense of sleeping well.  She wakes up tired as if she did not rest.  Sleep is not disturbed, no nightmares.  She does snore and has woken herself up gasping for breath.  Last sleep study 10 yrs ago c/w pain which is now much better controlled.  Review of Systems  Constitutional: Positive for fatigue. Negative for chills, fever and unexpected weight change.  HENT: Negative for congestion, hearing loss, tinnitus, trouble swallowing and voice change.   Eyes: Negative for visual disturbance.  Respiratory: Negative for cough, chest tightness, shortness of breath and wheezing.   Cardiovascular: Negative for chest pain, palpitations and leg swelling.  Gastrointestinal: Negative for abdominal pain, constipation, diarrhea and vomiting.  Endocrine: Negative for polydipsia and polyuria.  Genitourinary: Positive for menstrual problem (menses late). Negative for dysuria, frequency, genital sores, pelvic pain, vaginal bleeding and vaginal discharge.  Musculoskeletal: Positive for back pain. Negative for arthralgias,  gait problem and joint swelling.  Skin: Negative for color change and rash.  Neurological: Negative for dizziness, tremors, light-headedness and headaches.  Hematological: Negative for adenopathy. Does not bruise/bleed easily.  Psychiatric/Behavioral: Negative for dysphoric mood and sleep disturbance. The patient is not nervous/anxious.     Patient Active Problem List   Diagnosis Date Noted  . Trigger finger of both hands 05/05/2015  . Hypertension 01/09/2015  . Degenerative disc disease, lumbar 01/09/2015  . Allergic rhinitis 01/09/2015  . Arthritis of hand 01/09/2015  . Current tobacco use 01/09/2015  . Allergic reaction, urticaria 08/18/2009  . Arthralgia of hip or thigh 08/18/2009  . Awareness of heartbeats 08/18/2009    Prior to Admission medications   Medication Sig Start Date End Date Taking? Authorizing Provider  LARISSIA 0.1-20 MG-MCG tablet take 1 tablet by mouth once daily 05/13/16  Yes Reubin Milan, MD  traMADol-acetaminophen [facility]) 37.5-325 MG tablet take 1 to 2 tablets by mouth every 6 hours if needed 04/16/16  Yes Reubin Milan, MD  valsartan (DIOVAN) 160 MG tablet Take 1 tablet (160 mg total) by mouth daily. 07/09/16  Yes Reubin Milan, MD    No Known Allergies  Past Surgical History:  Procedure Laterality Date  . lumbar ESI  2007    Social History  Substance Use Topics  . Smoking status: Current Every Day Smoker    Packs/day: 0.50    Types: Cigarettes  . Smokeless tobacco: Current User  . Alcohol use No   Depression screen [facility] 2/9 10/08/2016 10/06/2015  Decreased Interest 0 0  Down, Depressed, Hopeless 0 0  PHQ - 2 Score  0 0     Medication list has been reviewed and updated.   Physical Exam  Constitutional: She is oriented to person, place, and time. She appears well-developed and well-nourished. No distress.  HENT:  Head: Normocephalic and atraumatic.  Right Ear: Tympanic membrane and ear canal normal.  Left Ear: Tympanic  membrane and ear canal normal.  Nose: Right sinus exhibits no maxillary sinus tenderness. Left sinus exhibits no maxillary sinus tenderness.  Mouth/Throat: Uvula is midline and oropharynx is clear and moist.  Eyes: Conjunctivae and EOM are normal. Right eye exhibits no discharge. Left eye exhibits no discharge. No scleral icterus.  Neck: Normal range of motion. Carotid bruit is not present. No erythema present. No thyromegaly present.  Cardiovascular: Normal rate, regular rhythm, normal heart sounds and normal pulses.   Pulmonary/Chest: Effort normal. No respiratory distress. She has no wheezes. Right breast exhibits no mass, no nipple discharge, no skin change and no tenderness. Left breast exhibits no mass, no nipple discharge, no skin change and no tenderness.  Abdominal: Soft. Bowel sounds are normal. There is no hepatosplenomegaly. There is no tenderness. There is no CVA tenderness.  Musculoskeletal: She exhibits no edema or tenderness.  Lymphadenopathy:    She has no cervical adenopathy.    She has no axillary adenopathy.  Neurological: She is alert and oriented to person, place, and time. She has normal reflexes. No cranial nerve deficit or sensory deficit.  Skin: Skin is warm, dry and intact. No rash noted.  Psychiatric: She has a normal mood and affect. Her speech is normal and behavior is normal. Thought content normal.  Nursing note and vitals reviewed.   BP 138/80 (BP Location: Right Arm, Patient Position: Sitting, Cuff Size: Normal)   Pulse 68   Ht 5\' 2"  (1.575 m)   Wt 145 lb 9.6 oz (66 kg)   LMP 08/14/2016 (Approximate) Comment: wants pregnancy test- just to verify  SpO2 100%   BMI 26.63 kg/m   Assessment and Plan: 1. Annual physical exam - HIV antibody - Lipid panel - POCT urinalysis dipstick  2. Breast cancer screening Schedule at [facility] - MM DIGITAL SCREENING BILATERAL  3. Essential hypertension controlled - CBC with Differential/Platelet - Comprehensive  metabolic panel - TSH  4. Degenerative disc disease, lumbar Continue tramadol as needed - traMADol-acetaminophen (ULTRACET) 37.5-325 MG tablet; take 1 to 2 tablets by mouth every 6 hours if needed  Dispense: 240 tablet; Refill: 5  5. Current tobacco use Cutting back  6. Sleep disorder breathing Call with covered sleep center  7. Subacute vaginitis - GC/chlamydia probe amp, urine - clindamycin (CLEOCIN) 300 MG capsule; Take 1 capsule (300 mg total) by mouth 3 (three) times daily.  Dispense: 30 capsule; Refill: 1 - fluconazole (DIFLUCAN) 100 MG tablet; Take 1 tablet (100 mg total) by mouth daily.  Dispense: 3 tablet; Refill: 0 - GC/Chlamydia Probe Amp  8. Amenorrhea Negative pregnancy Suspect peri menopausal - POCT urine pregnancy   Meds ordered this encounter  Medications  . traMADol-acetaminophen (ULTRACET) 37.5-325 MG tablet    Sig: take 1 to 2 tablets by mouth every 6 hours if needed    Dispense:  240 tablet    Refill:  5  . clindamycin (CLEOCIN) 300 MG capsule    Sig: Take 1 capsule (300 mg total) by mouth 3 (three) times daily.    Dispense:  30 capsule    Refill:  1  . fluconazole (DIFLUCAN) 100 MG tablet    Sig: Take 1 tablet (100  mg total) by mouth daily.    Dispense:  3 tablet    Refill:  0    Bari EdwardLaura Marlinda Miranda, MD [facility]  10/08/2016

## 2016-10-09 LAB — COMPREHENSIVE METABOLIC PANEL
A/G RATIO: 1.5 (ref 1.2–2.2)
ALK PHOS: 48 IU/L (ref 39–117)
ALT: 10 IU/L (ref 0–32)
AST: 14 IU/L (ref 0–40)
Albumin: 4.3 g/dL (ref 3.5–5.5)
BILIRUBIN TOTAL: 0.4 mg/dL (ref 0.0–1.2)
BUN/Creatinine Ratio: 7 — ABNORMAL LOW (ref 9–23)
BUN: 6 mg/dL (ref 6–24)
CALCIUM: 9.2 mg/dL (ref 8.7–10.2)
CO2: 21 mmol/L (ref 18–29)
Chloride: 101 mmol/L (ref 96–106)
Creatinine, Ser: 0.86 mg/dL (ref 0.57–1.00)
GFR, EST AFRICAN AMERICAN: 93 mL/min/{1.73_m2} (ref >59)
GFR, EST NON AFRICAN AMERICAN: 81 mL/min/{1.73_m2} (ref >59)
GLOBULIN, TOTAL: 2.9 g/dL (ref 1.5–4.5)
Glucose: 79 mg/dL (ref 65–99)
Potassium: 4.3 mmol/L (ref 3.5–5.2)
SODIUM: 136 mmol/L (ref 134–144)
Total Protein: 7.2 g/dL (ref 6.0–8.5)

## 2016-10-09 LAB — CBC WITH DIFFERENTIAL/PLATELET
BASOS: 0 %
Basophils Absolute: 0 10*3/uL (ref 0.0–0.2)
EOS (ABSOLUTE): 0 10*3/uL (ref 0.0–0.4)
Eos: 1 %
Hematocrit: 37.1 % (ref 34.0–46.6)
Hemoglobin: 11.9 g/dL (ref 11.1–15.9)
IMMATURE GRANULOCYTES: 0 %
Immature Grans (Abs): 0 10*3/uL (ref 0.0–0.1)
LYMPHS ABS: 2.2 10*3/uL (ref 0.7–3.1)
Lymphs: 41 %
MCH: 28.8 pg (ref 26.6–33.0)
MCHC: 32.1 g/dL (ref 31.5–35.7)
MCV: 90 fL (ref 79–97)
MONOS ABS: 0.3 10*3/uL (ref 0.1–0.9)
Monocytes: 5 %
Neutrophils Absolute: 2.8 10*3/uL (ref 1.4–7.0)
Neutrophils: 53 %
PLATELETS: 311 10*3/uL (ref 150–379)
RBC: 4.13 x10E6/uL (ref 3.77–5.28)
RDW: 13.8 % (ref 12.3–15.4)
WBC: 5.4 10*3/uL (ref 3.4–10.8)

## 2016-10-09 LAB — HIV ANTIBODY (ROUTINE TESTING W REFLEX): HIV Screen 4th Generation wRfx: NONREACTIVE

## 2016-10-09 LAB — LIPID PANEL
CHOL/HDL RATIO: 4.4 ratio (ref 0.0–4.4)
CHOLESTEROL TOTAL: 205 mg/dL — AB (ref 100–199)
HDL: 47 mg/dL (ref >39)
LDL CALC: 143 mg/dL — AB (ref 0–99)
Triglycerides: 77 mg/dL (ref 0–149)
VLDL CHOLESTEROL CAL: 15 mg/dL (ref 5–40)

## 2016-10-09 LAB — TSH: TSH: 0.943 u[IU]/mL (ref 0.450–4.500)

## 2016-10-11 ENCOUNTER — Other Ambulatory Visit: Payer: Self-pay | Admitting: Internal Medicine

## 2016-10-11 LAB — GC/CHLAMYDIA PROBE AMP
CHLAMYDIA, DNA PROBE: POSITIVE — AB
NEISSERIA GONORRHOEAE BY PCR: NEGATIVE

## 2016-10-11 MED ORDER — AZITHROMYCIN 500 MG PO TABS: 1000.0000 mg | ORAL_TABLET | Freq: Every day | ORAL | 0 refills | Status: DC

## 2016-11-05 ENCOUNTER — Other Ambulatory Visit: Payer: Self-pay | Admitting: Internal Medicine

## 2016-11-05 ENCOUNTER — Telehealth: Payer: Self-pay

## 2016-11-05 DIAGNOSIS — G4730 Sleep apnea, unspecified: Secondary | ICD-10-CM

## 2016-11-05 NOTE — Telephone Encounter (Signed)
Pt called stating the place she wishes to go for sleep study is [facility] [place]. Phone- [phone]. Fax- [phone].

## 2016-11-15 ENCOUNTER — Other Ambulatory Visit: Payer: Self-pay | Admitting: Internal Medicine

## 2016-11-15 DIAGNOSIS — M5136 Other intervertebral disc degeneration, lumbar region: Secondary | ICD-10-CM

## 2016-12-16 ENCOUNTER — Other Ambulatory Visit: Payer: Self-pay | Admitting: Internal Medicine

## 2016-12-16 ENCOUNTER — Telehealth: Payer: Self-pay

## 2016-12-16 DIAGNOSIS — I10 Essential (primary) hypertension: Secondary | ICD-10-CM

## 2016-12-16 MED ORDER — OLMESARTAN MEDOXOMIL 40 MG PO TABS: 40.0000 mg | ORAL_TABLET | Freq: Every day | ORAL | 1 refills | Status: DC

## 2016-12-16 NOTE — Telephone Encounter (Signed)
Sent in new Rx.  Needs a follow up BP check in 1-2 months.

## 2016-12-16 NOTE — Telephone Encounter (Signed)
Pt [facility] called stating pt needs new medication due to valsartan recall. Please advise.

## 2016-12-16 NOTE — Telephone Encounter (Signed)
Pt informed via VM

## 2017-03-07 ENCOUNTER — Other Ambulatory Visit: Payer: Self-pay | Admitting: Internal Medicine

## 2017-03-07 DIAGNOSIS — I10 Essential (primary) hypertension: Secondary | ICD-10-CM

## 2017-06-01 ENCOUNTER — Other Ambulatory Visit: Payer: Self-pay | Admitting: Internal Medicine

## 2017-06-01 DIAGNOSIS — M5136 Other intervertebral disc degeneration, lumbar region: Secondary | ICD-10-CM

## 2017-07-16 ENCOUNTER — Other Ambulatory Visit: Payer: Self-pay | Admitting: Internal Medicine

## 2017-07-16 ENCOUNTER — Ambulatory Visit: Payer: Self-pay | Admitting: Internal Medicine

## 2017-09-26 ENCOUNTER — Telehealth: Payer: Self-pay

## 2017-09-26 ENCOUNTER — Other Ambulatory Visit: Payer: Self-pay | Admitting: Internal Medicine

## 2017-09-26 DIAGNOSIS — I10 Essential (primary) hypertension: Secondary | ICD-10-CM

## 2017-09-26 MED ORDER — OLMESARTAN MEDOXOMIL 40 MG PO TABS: 40.0000 mg | ORAL_TABLET | Freq: Every day | ORAL | 0 refills | Status: DC

## 2017-09-26 NOTE — Telephone Encounter (Signed)
Sent to [facility] in [place]

## 2017-09-26 NOTE — Telephone Encounter (Signed)
Patient called says pharmacy sent    Refill a week ago and has not gotten it I said we have not seen this come over. Needs refill on BP meds. Was asked in July to F/U in 2 mo but I saw after I hung up. She is out

## 2017-10-09 ENCOUNTER — Other Ambulatory Visit: Payer: Self-pay

## 2017-10-09 ENCOUNTER — Encounter: Payer: Self-pay | Admitting: Internal Medicine

## 2017-10-09 ENCOUNTER — Ambulatory Visit (INDEPENDENT_AMBULATORY_CARE_PROVIDER_SITE_OTHER): Payer: Managed Care, Other (non HMO) | Admitting: Internal Medicine

## 2017-10-09 VITALS — BP 122/84 | HR 74 | Resp 16 | Ht 62.0 in | Wt 148.0 lb

## 2017-10-09 DIAGNOSIS — Z0001 Encounter for general adult medical examination with abnormal findings: Secondary | ICD-10-CM

## 2017-10-09 DIAGNOSIS — Z1231 Encounter for screening mammogram for malignant neoplasm of breast: Secondary | ICD-10-CM | POA: Diagnosis not present

## 2017-10-09 DIAGNOSIS — M5136 Other intervertebral disc degeneration, lumbar region: Secondary | ICD-10-CM

## 2017-10-09 DIAGNOSIS — Z113 Encounter for screening for infections with a predominantly sexual mode of transmission: Secondary | ICD-10-CM

## 2017-10-09 DIAGNOSIS — G4730 Sleep apnea, unspecified: Secondary | ICD-10-CM | POA: Diagnosis not present

## 2017-10-09 DIAGNOSIS — M6530 Trigger finger, unspecified finger: Secondary | ICD-10-CM

## 2017-10-09 DIAGNOSIS — M51369 Other intervertebral disc degeneration, lumbar region without mention of lumbar back pain or lower extremity pain: Secondary | ICD-10-CM

## 2017-10-09 DIAGNOSIS — F17210 Nicotine dependence, cigarettes, uncomplicated: Secondary | ICD-10-CM | POA: Diagnosis not present

## 2017-10-09 DIAGNOSIS — Z0000 Encounter for general adult medical examination without abnormal findings: Secondary | ICD-10-CM

## 2017-10-09 DIAGNOSIS — I10 Essential (primary) hypertension: Secondary | ICD-10-CM

## 2017-10-09 DIAGNOSIS — Z202 Contact with and (suspected) exposure to infections with a predominantly sexual mode of transmission: Secondary | ICD-10-CM

## 2017-10-09 DIAGNOSIS — IMO0002 Reserved for concepts with insufficient information to code with codable children: Secondary | ICD-10-CM

## 2017-10-09 LAB — POCT URINALYSIS DIPSTICK
BILIRUBIN UA: NEGATIVE
Blood, UA: NEGATIVE
GLUCOSE UA: NEGATIVE
Ketones, UA: NEGATIVE
Leukocytes, UA: NEGATIVE
Nitrite, UA: NEGATIVE
Protein, UA: NEGATIVE
Spec Grav, UA: 1.02 (ref 1.010–1.025)
Urobilinogen, UA: 0.2 E.U./dL
pH, UA: 7.5 (ref 5.0–8.0)

## 2017-10-09 MED ORDER — FLUCONAZOLE 100 MG PO TABS: 100.0000 mg | ORAL_TABLET | Freq: Every day | ORAL | 0 refills | Status: DC

## 2017-10-09 MED ORDER — OLMESARTAN MEDOXOMIL 40 MG PO TABS: 40.0000 mg | ORAL_TABLET | Freq: Every day | ORAL | 5 refills | Status: DC

## 2017-10-09 NOTE — Patient Instructions (Signed)

## 2017-10-09 NOTE — Progress Notes (Signed)
Date:  10/09/2017   Name:  April Fernandez   DOB:  1970/06/29   MRN:  [id number]   Chief Complaint: Annual Exam (Feels tired even if she gets extra sleep. Falls asleep fast when she does sleep. ); Back Pain (Right side back and hip hurting today. ); Hand Pain (Left hand 3rd digit swelling. When waking up she has to open it up where it is stuck bent and stiff. ); and Panic Attack (Has been having a few episodes in middle of night where she can not breathe and attributed to panic attacks )  April Fernandez is a 47 y.o. female who presents today for her Complete Annual Exam. She feels fairly well. She reports exercising very little.  She lives alone but is sexually active with one partner.  She wants to be screened for STDs.   She reports she is sleeping fairly well but awakens tired.  Also several episodes of waking up gasping for breath.  She has daytime somnolence but no AM headaches. Epworth score 18/24.  Hypertension  This is a chronic problem. The problem is controlled. Pertinent negatives include no chest pain, headaches, palpitations or shortness of breath. Past treatments include angiotensin blockers. The current treatment provides significant improvement.  Back Pain  This is a chronic problem. The problem occurs daily. The problem is unchanged. The pain is present in the lumbar spine. The pain is moderate. Associated symptoms include weakness (in legs). Pertinent negatives include no abdominal pain, chest pain, dysuria, fever, headaches or numbness. Risk factors include lack of exercise.   Trigger finger - worsening on left hand.  She has to pull it straight in the morning and it is becoming more painful.  Review of Systems  Constitutional: Positive for fatigue (falls asleep easily during the day). Negative for chills, fever and unexpected weight change.  HENT: Negative for congestion, hearing loss, tinnitus, trouble swallowing and voice change.   Eyes: Negative for visual  disturbance.  Respiratory: Negative for cough, chest tightness, shortness of breath and wheezing.   Cardiovascular: Negative for chest pain, palpitations and leg swelling.  Gastrointestinal: Negative for abdominal pain, constipation, diarrhea and vomiting.  Endocrine: Negative for polydipsia and polyuria.  Genitourinary: Negative for dysuria, frequency, genital sores, vaginal bleeding and vaginal discharge.  Musculoskeletal: Positive for back pain. Negative for arthralgias, gait problem and joint swelling.  Skin: Negative for color change and rash.  Neurological: Positive for weakness (in legs). Negative for dizziness, tremors, light-headedness, numbness and headaches.  Hematological: Negative for adenopathy. Does not bruise/bleed easily.  Psychiatric/Behavioral: Negative for dysphoric mood and sleep disturbance. The patient is not nervous/anxious.     Patient Active Problem List   Diagnosis Date Noted  . Trigger finger of both hands 05/05/2015  . Essential hypertension 01/09/2015  . Degenerative disc disease, lumbar 01/09/2015  . Allergic rhinitis 01/09/2015  . Arthritis of hand 01/09/2015  . Current tobacco use 01/09/2015  . Arthralgia of hip or thigh 08/18/2009  . Awareness of heartbeats 08/18/2009    Prior to Admission medications   Medication Sig Start Date End Date Taking? Authorizing Provider  olmesartan (BENICAR) 40 MG tablet Take 1 tablet (40 mg total) by mouth daily. 09/26/17  Yes Reubin Milan, MD  SF 1.1 % GEL dental gel APPLY TO TEETH MORNING AND EVENINGS IN TRAY OR BY BRUSH. DO NOT E...  (REFER TO PRESCRIPTION NOTES). 07/27/17  Yes [provider]  traMADol-acetaminophen (ULTRACET) 37.5-325 MG tablet take 1 to 2 tablets by mouth every  6 hours if needed 06/02/17  Yes Reubin Milan, MD  VIENVA 0.1-20 MG-MCG tablet take 1 tablet by mouth once daily 06/02/17  Yes Reubin Milan, MD    No Known Allergies  Past Surgical History:  Procedure Laterality Date  .  lumbar ESI  2007    Social History   Tobacco Use  . Smoking status: Current Every Day Smoker    Packs/day: 0.50    Types: Cigarettes  . Smokeless tobacco: Never Used  . Tobacco comment: electric vape cigg   Substance Use Topics  . Alcohol use: No    Alcohol/week: 0.0 oz  . Drug use: No     Medication list has been reviewed and updated.  PHQ 2/9 Scores 10/09/2017 10/08/2016 10/06/2015  PHQ - 2 Score 0 0 0  PHQ- 9 Score 3 - -    Physical Exam  Constitutional: She is oriented to person, place, and time. She appears well-developed and well-nourished. No distress.  HENT:  Head: Normocephalic and atraumatic.  Right Ear: Tympanic membrane and ear canal normal.  Left Ear: Tympanic membrane and ear canal normal.  Nose: Right sinus exhibits no maxillary sinus tenderness. Left sinus exhibits no maxillary sinus tenderness.  Mouth/Throat: Uvula is midline and oropharynx is clear and moist.  Eyes: Conjunctivae and EOM are normal. Right eye exhibits no discharge. Left eye exhibits no discharge. No scleral icterus.  Neck: Normal range of motion. Carotid bruit is not present. No erythema present. No thyromegaly present.  Cardiovascular: Normal rate, regular rhythm, normal heart sounds and normal pulses.  Pulmonary/Chest: Effort normal. No respiratory distress. She has no wheezes. Right breast exhibits no mass, no nipple discharge, no skin change and no tenderness. Left breast exhibits no mass, no nipple discharge, no skin change and no tenderness.  Abdominal: Soft. Bowel sounds are normal. There is no hepatosplenomegaly. There is no tenderness. There is no CVA tenderness.  Musculoskeletal:       Right hip: She exhibits normal range of motion and normal strength.       Left hip: She exhibits normal range of motion and normal strength.       Lumbar back: She exhibits normal range of motion, no tenderness and no spasm.  Lymphadenopathy:    She has no cervical adenopathy.    She has no axillary  adenopathy.  Neurological: She is alert and oriented to person, place, and time. She has normal reflexes. No cranial nerve deficit or sensory deficit.  Skin: Skin is warm, dry and intact. No rash noted.  Psychiatric: She has a normal mood and affect. Her speech is normal and behavior is normal. Thought content normal.  Nursing note and vitals reviewed.   BP 122/84   Pulse 74   Resp 16   Ht  (1.575 m)   Wt 148 lb (67.1 kg)   SpO2 100%   BMI 27.07 kg/m   Assessment and Plan: 1. Annual physical exam Need to begin regular exercise Smoking cessation discussed - pt is not interested in quitting - Lipid panel - TSH - POCT urinalysis dipstick  2. Essential hypertension controlled - CBC with Differential/Platelet - Comprehensive metabolic panel - olmesartan (BENICAR) 40 MG tablet; Take 1 tablet (40 mg total) by mouth daily.  Dispense: 30 tablet; Refill: 5  3. Trigger finger of both hands Refer to Ortho - Ambulatory referral to Orthopedic Surgery  4. Degenerative disc disease, lumbar Stable, continue Tramadol Consider Ortho evaluation if worsening  5. Sleep-disordered breathing Suspect sleep apnea -  Home sleep test  6. Possible exposure to STD - HIV antibody - GC/Chlamydia Probe Amp - RPR  7. Encounter for screening mammogram for breast cancer Pt to schedule in [place] - MM DIGITAL SCREENING BILATERAL   Meds ordered this encounter  Medications  . olmesartan (BENICAR) 40 MG tablet    Sig: Take 1 tablet (40 mg total) by mouth daily.    Dispense:  30 tablet    Refill:  5  . fluconazole (DIFLUCAN) 100 MG tablet    Sig: Take 1 tablet (100 mg total) by mouth daily.    Dispense:  3 tablet    Refill:  0    Partially dictated using Animal nutritionist. Any errors are unintentional.  Bari Edward, MD [facility]  10/09/2017

## 2017-10-10 LAB — COMPREHENSIVE METABOLIC PANEL
ALT: 9 IU/L (ref 0–32)
AST: 15 IU/L (ref 0–40)
Albumin/Globulin Ratio: 1.9 (ref 1.2–2.2)
Albumin: 4.5 g/dL (ref 3.5–5.5)
Alkaline Phosphatase: 48 IU/L (ref 39–117)
BUN/Creatinine Ratio: 10 (ref 9–23)
BUN: 8 mg/dL (ref 6–24)
Bilirubin Total: 0.3 mg/dL (ref 0.0–1.2)
CALCIUM: 9.4 mg/dL (ref 8.7–10.2)
CO2: 22 mmol/L (ref 20–29)
CREATININE: 0.84 mg/dL (ref 0.57–1.00)
Chloride: 102 mmol/L (ref 96–106)
GFR calc Af Amer: 95 mL/min/{1.73_m2} (ref >59)
GFR, EST NON AFRICAN AMERICAN: 82 mL/min/{1.73_m2} (ref >59)
GLOBULIN, TOTAL: 2.4 g/dL (ref 1.5–4.5)
Glucose: 74 mg/dL (ref 65–99)
Potassium: 4.8 mmol/L (ref 3.5–5.2)
SODIUM: 137 mmol/L (ref 134–144)
Total Protein: 6.9 g/dL (ref 6.0–8.5)

## 2017-10-10 LAB — LIPID PANEL
CHOLESTEROL TOTAL: 215 mg/dL — AB (ref 100–199)
Chol/HDL Ratio: 3.8 ratio (ref 0.0–4.4)
HDL: 56 mg/dL (ref >39)
LDL CALC: 148 mg/dL — AB (ref 0–99)
TRIGLYCERIDES: 57 mg/dL (ref 0–149)
VLDL Cholesterol Cal: 11 mg/dL (ref 5–40)

## 2017-10-10 LAB — CBC WITH DIFFERENTIAL/PLATELET
Basophils Absolute: 0 10*3/uL (ref 0.0–0.2)
Basos: 1 %
EOS (ABSOLUTE): 0.1 10*3/uL (ref 0.0–0.4)
EOS: 1 %
HEMOGLOBIN: 11.8 g/dL (ref 11.1–15.9)
Hematocrit: 36.9 % (ref 34.0–46.6)
IMMATURE GRANS (ABS): 0 10*3/uL (ref 0.0–0.1)
IMMATURE GRANULOCYTES: 0 %
LYMPHS: 39 %
Lymphocytes Absolute: 1.7 10*3/uL (ref 0.7–3.1)
MCH: 29.4 pg (ref 26.6–33.0)
MCHC: 32 g/dL (ref 31.5–35.7)
MCV: 92 fL (ref 79–97)
MONOCYTES: 9 %
MONOS ABS: 0.4 10*3/uL (ref 0.1–0.9)
NEUTROS PCT: 50 %
Neutrophils Absolute: 2.2 10*3/uL (ref 1.4–7.0)
Platelets: 307 10*3/uL (ref 150–379)
RBC: 4.01 x10E6/uL (ref 3.77–5.28)
RDW: 13.5 % (ref 12.3–15.4)
WBC: 4.3 10*3/uL (ref 3.4–10.8)

## 2017-10-10 LAB — TSH: TSH: 1.08 u[IU]/mL (ref 0.450–4.500)

## 2017-10-10 LAB — HIV ANTIBODY (ROUTINE TESTING W REFLEX): HIV Screen 4th Generation wRfx: NONREACTIVE

## 2017-10-10 LAB — RPR: RPR Ser Ql: NONREACTIVE

## 2017-10-12 LAB — GC/CHLAMYDIA PROBE AMP
CHLAMYDIA, DNA PROBE: NEGATIVE
NEISSERIA GONORRHOEAE BY PCR: NEGATIVE

## 2017-10-23 ENCOUNTER — Other Ambulatory Visit: Payer: Self-pay | Admitting: Internal Medicine

## 2017-10-23 DIAGNOSIS — I10 Essential (primary) hypertension: Secondary | ICD-10-CM

## 2017-11-11 ENCOUNTER — Other Ambulatory Visit: Payer: Self-pay

## 2017-11-11 DIAGNOSIS — IMO0002 Reserved for concepts with insufficient information to code with codable children: Secondary | ICD-10-CM

## 2017-11-28 ENCOUNTER — Other Ambulatory Visit: Payer: Self-pay | Admitting: Internal Medicine

## 2017-11-28 DIAGNOSIS — M5136 Other intervertebral disc degeneration, lumbar region: Secondary | ICD-10-CM

## 2017-11-28 NOTE — Telephone Encounter (Signed)
Lmom for patient to collect Rx at pharmacy

## 2018-03-27 ENCOUNTER — Other Ambulatory Visit: Payer: Self-pay

## 2018-03-27 DIAGNOSIS — I10 Essential (primary) hypertension: Secondary | ICD-10-CM

## 2018-03-27 MED ORDER — OLMESARTAN MEDOXOMIL 40 MG PO TABS: ORAL_TABLET | ORAL | 1 refills | Status: DC

## 2018-06-01 ENCOUNTER — Other Ambulatory Visit: Payer: Self-pay | Admitting: Internal Medicine

## 2018-06-01 DIAGNOSIS — I10 Essential (primary) hypertension: Secondary | ICD-10-CM

## 2018-06-01 DIAGNOSIS — M5136 Other intervertebral disc degeneration, lumbar region: Secondary | ICD-10-CM

## 2018-06-01 NOTE — Telephone Encounter (Signed)
lvm to call back-ah01/06

## 2018-06-04 ENCOUNTER — Other Ambulatory Visit: Payer: Self-pay | Admitting: Internal Medicine

## 2018-06-05 ENCOUNTER — Telehealth: Payer: Self-pay

## 2018-06-05 NOTE — Telephone Encounter (Signed)
I just refilled her tramadol 3 days ago.

## 2018-06-05 NOTE — Telephone Encounter (Signed)
Patient called stating she has 3 refills left on her Tramadol but the Rx is now expired. Wanted to know if we can send in a new Rx to [facility] in [place]. She only uses this PRN.   Please Advise.

## 2018-06-05 NOTE — Telephone Encounter (Signed)
Patient informed. 

## 2018-09-02 ENCOUNTER — Other Ambulatory Visit: Payer: Self-pay

## 2018-09-03 ENCOUNTER — Encounter: Payer: Self-pay | Admitting: Internal Medicine

## 2018-09-03 ENCOUNTER — Ambulatory Visit: Payer: BLUE CROSS/BLUE SHIELD | Admitting: Internal Medicine

## 2018-09-03 VITALS — BP 126/76 | HR 87 | Ht 62.0 in | Wt 155.0 lb

## 2018-09-03 DIAGNOSIS — S161XXA Strain of muscle, fascia and tendon at neck level, initial encounter: Secondary | ICD-10-CM | POA: Diagnosis not present

## 2018-09-03 DIAGNOSIS — I10 Essential (primary) hypertension: Secondary | ICD-10-CM | POA: Diagnosis not present

## 2018-09-03 MED ORDER — NAPROXEN 500 MG PO TABS: 500.0000 mg | ORAL_TABLET | Freq: Two times a day (BID) | ORAL | 0 refills | Status: DC

## 2018-09-03 MED ORDER — CYCLOBENZAPRINE HCL 10 MG PO TABS: 10.0000 mg | ORAL_TABLET | Freq: Every day | ORAL | 0 refills | Status: DC

## 2018-09-03 NOTE — Progress Notes (Signed)
Date:  09/03/2018   Name:  April Fernandez   DOB:  1971/04/26   MRN:  [id number]   Chief Complaint: Neck Pain (Rt Neck and shoulder pain. Stabbing. Started 5 days ago. have had inflammation in the past but unsure of what this is.   )  Neck Pain   This is a new problem. The current episode started in the past 7 days. The problem occurs constantly. The problem has been unchanged. The pain is associated with lifting a heavy object (moved a 5 gallon can of pain). The quality of the pain is described as stabbing. The pain is moderate. The symptoms are aggravated by twisting. Associated symptoms include numbness (tingling in fingers of both hands during the night - previous to current issues). Pertinent negatives include no chest pain, fever, headaches, pain with swallowing, syncope, trouble swallowing or weakness. She has tried oral narcotics for the symptoms. The treatment provided mild relief.  Hypertension  This is a chronic problem. The problem is controlled. Associated symptoms include neck pain. Pertinent negatives include no chest pain, headaches, palpitations or shortness of breath. Past treatments include angiotensin blockers. The current treatment provides significant improvement.  She has gained some weight since last year - still smoking as well.  Recommend exercise and dietary changes.   Review of Systems  Constitutional: Negative for fever.  HENT: Negative for trouble swallowing.   Eyes: Negative for visual disturbance.  Respiratory: Negative for cough, chest tightness, shortness of breath and wheezing.   Cardiovascular: Negative for chest pain, palpitations, leg swelling and syncope.  Musculoskeletal: Positive for myalgias and neck pain. Negative for joint swelling.  Skin: Negative for wound.  Neurological: Positive for numbness (tingling in fingers of both hands during the night - previous to current issues). Negative for weakness and headaches.  Psychiatric/Behavioral: Negative  for dysphoric mood and sleep disturbance.    Patient Active Problem List   Diagnosis Date Noted  . Trigger finger of both hands 05/05/2015  . Essential hypertension 01/09/2015  . Degenerative disc disease, lumbar 01/09/2015  . Allergic rhinitis 01/09/2015  . Arthritis of hand 01/09/2015  . Current tobacco use 01/09/2015  . Arthralgia of hip or thigh 08/18/2009  . Awareness of heartbeats 08/18/2009    No Known Allergies  Past Surgical History:  Procedure Laterality Date  . lumbar ESI  2007    Social History   Tobacco Use  . Smoking status: Current Every Day Smoker    Packs/day: 0.50    Years: 34.00    Pack years: 17.00    Types: Cigarettes  . Smokeless tobacco: Never Used  . Tobacco comment: electric vape cigg   Substance Use Topics  . Alcohol use: No    Alcohol/week: 0.0 standard drinks  . Drug use: No     Medication list has been reviewed and updated.  Current Meds  Medication Sig  . olmesartan (BENICAR) 40 MG tablet TAKE 1 TABLET(40 MG) BY MOUTH DAILY  . SF 1.1 % GEL dental gel APPLY TO TEETH MORNING AND EVENINGS IN TRAY OR BY BRUSH. DO NOT E...  (REFER TO PRESCRIPTION NOTES).  . traMADol-acetaminophen (ULTRACET) 37.5-325 MG tablet TAKE 1 TO 2 TABLETS BY MOUTH EVERY 6 HOURS AS NEEDED  . VIENVA 0.1-20 MG-MCG tablet TAKE 1 TABLET BY MOUTH ONCE DAILY    PHQ 2/9 Scores 09/03/2018 10/09/2017 10/08/2016 10/06/2015  PHQ - 2 Score 0 0 0 0  PHQ- 9 Score - 3 - -    BP Readings from Last  3 Encounters:  09/03/18 126/76  10/09/17 122/84  10/08/16 129/80    Physical Exam Vitals signs and nursing note reviewed.  Constitutional:      General: She is not in acute distress.    Appearance: She is well-developed.  HENT:     Head: Normocephalic and atraumatic.  Cardiovascular:     Rate and Rhythm: Normal rate and regular rhythm.  Pulmonary:     Effort: Pulmonary effort is normal. No respiratory distress.     Breath sounds: No wheezing or rales.  Musculoskeletal: Normal  range of motion.     Cervical back: She exhibits spasm (and tenderness along left mid cervical region). She exhibits normal range of motion and no bony tenderness.  Lymphadenopathy:     Cervical: No cervical adenopathy.  Skin:    General: Skin is warm and dry.     Findings: No rash.  Neurological:     Mental Status: She is alert and oriented to person, place, and time.     Sensory: Sensation is intact.     Motor: Motor function is intact.     Deep Tendon Reflexes:     Reflex Scores:      Bicep reflexes are 2+ on the right side and 2+ on the left side. Psychiatric:        Behavior: Behavior normal.        Thought Content: Thought content normal.     Wt Readings from Last 3 Encounters:  09/03/18 155 lb (70.3 kg)  10/09/17 148 lb (67.1 kg)  10/08/16 145 lb 9.6 oz (66 kg)    BP 126/76   Pulse 87   Ht 5\' 2"  (1.575 m)   Wt 155 lb (70.3 kg) Comment: Had to hold purse  SpO2 100%   BMI 28.35 kg/m   Assessment and Plan: 1. Acute strain of neck muscle, initial encounter Continue Tramadol as needed Ice or heat - cyclobenzaprine (FLEXERIL) 10 MG tablet; Take 1 tablet (10 mg total) by mouth at bedtime.  Dispense: 30 tablet; Refill: 0 - naproxen (NAPROSYN) 500 MG tablet; Take 1 tablet (500 mg total) by mouth 2 (two) times daily with a meal.  Dispense: 60 tablet; Refill: 0  2. Essential hypertension Controlled Advised work on diet, exercise, weight loss   Partially dictated using Animal nutritionistDragon software. Any errors are unintentional.  Bari EdwardLaura Chanley Mcenery, MD [facility]  09/03/2018

## 2018-09-03 NOTE — Patient Instructions (Signed)
This information is directly available on the CDC website: https://www.cdc.gov/coronavirus/2019-ncov/if-you-are-sick/steps-when-sick.html    Source:CDC Reference to specific commercial products, manufacturers, companies, or trademarks does not constitute its endorsement or recommendation by the U.S. Government, Department of Health and Human Services, or Centers for Disease Control and Prevention.  

## 2018-09-30 ENCOUNTER — Other Ambulatory Visit: Payer: Self-pay | Admitting: Internal Medicine

## 2018-09-30 DIAGNOSIS — S161XXA Strain of muscle, fascia and tendon at neck level, initial encounter: Secondary | ICD-10-CM

## 2018-10-05 ENCOUNTER — Other Ambulatory Visit: Payer: Self-pay | Admitting: Internal Medicine

## 2018-10-05 DIAGNOSIS — I10 Essential (primary) hypertension: Secondary | ICD-10-CM

## 2018-10-23 ENCOUNTER — Encounter

## 2018-10-28 DIAGNOSIS — K047 Periapical abscess without sinus: Secondary | ICD-10-CM | POA: Diagnosis not present

## 2018-10-28 DIAGNOSIS — S025XXB Fracture of tooth (traumatic), initial encounter for open fracture: Secondary | ICD-10-CM | POA: Diagnosis not present

## 2018-12-10 DIAGNOSIS — M4316 Spondylolisthesis, lumbar region: Secondary | ICD-10-CM | POA: Diagnosis not present

## 2018-12-10 DIAGNOSIS — M5432 Sciatica, left side: Secondary | ICD-10-CM | POA: Diagnosis not present

## 2018-12-10 DIAGNOSIS — M5431 Sciatica, right side: Secondary | ICD-10-CM | POA: Diagnosis not present

## 2018-12-16 ENCOUNTER — Encounter: Payer: BLUE CROSS/BLUE SHIELD | Admitting: Internal Medicine

## 2018-12-16 NOTE — Progress Notes (Deleted)
    Date:  12/16/2018   Name:  April Fernandez   DOB:  [birth year]   MRN:  [id number]   Chief Complaint: No chief complaint on file. April Fernandez is a 48 y.o. female who presents today for her Complete Annual Exam. She feels {DESC; WELL/FAIRLY WELL/POORLY:18703}. She reports exercising ***. She reports she is sleeping {DESC; WELL/FAIRLY WELL/POORLY:18703}.   Pap 2016 - neg with cotesting Mammogram - none in chart Colonoscopy due now  Hypertension Past treatments include angiotensin blockers and diuretics. The current treatment provides significant improvement. There are no compliance problems.   Back Pain  Tobacco use -   Review of Systems  Musculoskeletal: Positive for back pain.    Patient Active Problem List   Diagnosis Date Noted  . Trigger finger of both hands 05/05/2015  . Essential hypertension 01/09/2015  . Degenerative disc disease, lumbar 01/09/2015  . Allergic rhinitis 01/09/2015  . Arthritis of hand 01/09/2015  . Current tobacco use 01/09/2015  . Arthralgia of hip or thigh 08/18/2009  . Awareness of heartbeats 08/18/2009    No Known Allergies  Past Surgical History:  Procedure Laterality Date  . lumbar ESI  2007    Social History   Tobacco Use  . Smoking status: Current Every Day Smoker    Packs/day: 0.50    Years: 34.00    Pack years: 17.00    Types: Cigarettes  . Smokeless tobacco: Never Used  . Tobacco comment: electric vape cigg   Substance Use Topics  . Alcohol use: No    Alcohol/week: 0.0 standard drinks  . Drug use: No     Medication list has been reviewed and updated.  No outpatient medications have been marked as taking for the 12/16/18 encounter (Appointment) with Glean Hess, MD.    [facility] 2/9 Scores 09/03/2018 10/09/2017 10/08/2016 10/06/2015  PHQ - 2 Score 0 0 0 0  PHQ- 9 Score - 3 - -    BP Readings from Last 3 Encounters:  09/03/18 126/76  10/09/17 122/84  10/08/16 129/80    Physical Exam  Wt Readings from Last 3  Encounters:  09/03/18 155 lb (70.3 kg)  10/09/17 148 lb (67.1 kg)  10/08/16 145 lb 9.6 oz (66 kg)    There were no vitals taken for this visit.  Assessment and Plan:

## 2019-01-04 DIAGNOSIS — Z20828 Contact with and (suspected) exposure to other viral communicable diseases: Secondary | ICD-10-CM | POA: Diagnosis not present

## 2019-01-04 DIAGNOSIS — R0989 Other specified symptoms and signs involving the circulatory and respiratory systems: Secondary | ICD-10-CM | POA: Diagnosis not present

## 2019-01-12 ENCOUNTER — Other Ambulatory Visit: Payer: Self-pay | Admitting: Internal Medicine

## 2019-01-12 DIAGNOSIS — M7062 Trochanteric bursitis, left hip: Secondary | ICD-10-CM | POA: Diagnosis not present

## 2019-01-12 DIAGNOSIS — I10 Essential (primary) hypertension: Secondary | ICD-10-CM

## 2019-01-29 DIAGNOSIS — M545 Low back pain: Secondary | ICD-10-CM | POA: Diagnosis not present

## 2019-01-29 DIAGNOSIS — M25552 Pain in left hip: Secondary | ICD-10-CM | POA: Diagnosis not present

## 2019-02-01 ENCOUNTER — Other Ambulatory Visit: Payer: Self-pay | Admitting: Internal Medicine

## 2019-02-01 DIAGNOSIS — M5136 Other intervertebral disc degeneration, lumbar region: Secondary | ICD-10-CM

## 2019-02-12 DIAGNOSIS — M25552 Pain in left hip: Secondary | ICD-10-CM | POA: Diagnosis not present

## 2019-02-12 DIAGNOSIS — M545 Low back pain: Secondary | ICD-10-CM | POA: Diagnosis not present

## 2019-03-01 ENCOUNTER — Encounter: Payer: BLUE CROSS/BLUE SHIELD | Admitting: Internal Medicine

## 2019-03-01 NOTE — Progress Notes (Deleted)
    Date:  03/01/2019   Name:  April Fernandez   DOB:  03/23/1971   MRN:  [id number]   Chief Complaint: No chief complaint on file. April Fernandez is a 48 y.o. female who presents today for her Complete Annual Exam. She feels {DESC; WELL/FAIRLY WELL/POORLY:18703}. She reports exercising ***. She reports she is sleeping {DESC; WELL/FAIRLY WELL/POORLY:18703}.   Mammogram - due Colonoscopy - due Pap 2016  Hypertension This is a chronic problem. The problem is controlled. Risk factors for coronary artery disease include smoking/tobacco exposure. Past treatments include angiotensin blockers. The current treatment provides significant improvement.  Back Pain This is a chronic problem. The problem occurs daily. Treatments tried: on tramadol qid; seen recently by Ortho and started on PTx, mobic and recommended weaning off of tramadol.   Lab Results  Component Value Date   CREATININE 0.84 10/09/2017   BUN 8 10/09/2017   NA 137 10/09/2017   K 4.8 10/09/2017   CL 102 10/09/2017   CO2 22 10/09/2017   Lab Results  Component Value Date   CHOL 215 (H) 10/09/2017   HDL 56 10/09/2017   LDLCALC 148 (H) 10/09/2017   TRIG 57 10/09/2017   CHOLHDL 3.8 10/09/2017   Lab Results  Component Value Date   TSH 1.080 10/09/2017     Review of Systems  Musculoskeletal: Positive for back pain.    Patient Active Problem List   Diagnosis Date Noted  . Trigger finger of both hands 05/05/2015  . Essential hypertension 01/09/2015  . Degenerative disc disease, lumbar 01/09/2015  . Allergic rhinitis 01/09/2015  . Arthritis of hand 01/09/2015  . Current tobacco use 01/09/2015  . Arthralgia of hip or thigh 08/18/2009  . Awareness of heartbeats 08/18/2009    No Known Allergies  Past Surgical History:  Procedure Laterality Date  . lumbar ESI  2007    Social History   Tobacco Use  . Smoking status: Current Every Day Smoker    Packs/day: 0.50    Years: 34.00    Pack years: 17.00    Types:  Cigarettes  . Smokeless tobacco: Never Used  . Tobacco comment: electric vape cigg   Substance Use Topics  . Alcohol use: No    Alcohol/week: 0.0 standard drinks  . Drug use: No     Medication list has been reviewed and updated.  No outpatient medications have been marked as taking for the 03/01/19 encounter (Appointment) with Glean Hess, MD.    [facility] 2/9 Scores 09/03/2018 10/09/2017 10/08/2016 10/06/2015  PHQ - 2 Score 0 0 0 0  PHQ- 9 Score - 3 - -    BP Readings from Last 3 Encounters:  09/03/18 126/76  10/09/17 122/84  10/08/16 129/80    Physical Exam  Wt Readings from Last 3 Encounters:  09/03/18 155 lb (70.3 kg)  10/09/17 148 lb (67.1 kg)  10/08/16 145 lb 9.6 oz (66 kg)    There were no vitals taken for this visit.  Assessment and Plan:

## 2019-03-03 DIAGNOSIS — M25552 Pain in left hip: Secondary | ICD-10-CM | POA: Diagnosis not present

## 2019-03-03 DIAGNOSIS — M545 Low back pain: Secondary | ICD-10-CM | POA: Diagnosis not present

## 2019-03-05 ENCOUNTER — Encounter: Payer: BLUE CROSS/BLUE SHIELD | Admitting: Internal Medicine

## 2019-03-16 ENCOUNTER — Other Ambulatory Visit: Payer: Self-pay | Admitting: Internal Medicine

## 2019-03-16 DIAGNOSIS — M5136 Other intervertebral disc degeneration, lumbar region: Secondary | ICD-10-CM

## 2019-05-12 ENCOUNTER — Other Ambulatory Visit: Payer: Self-pay | Admitting: Internal Medicine

## 2019-05-12 DIAGNOSIS — I10 Essential (primary) hypertension: Secondary | ICD-10-CM

## 2019-05-27 ENCOUNTER — Other Ambulatory Visit: Payer: Self-pay

## 2019-05-27 ENCOUNTER — Ambulatory Visit (INDEPENDENT_AMBULATORY_CARE_PROVIDER_SITE_OTHER): Payer: BLUE CROSS/BLUE SHIELD | Admitting: Internal Medicine

## 2019-05-27 ENCOUNTER — Encounter

## 2019-05-27 ENCOUNTER — Encounter: Payer: Self-pay | Admitting: Internal Medicine

## 2019-05-27 VITALS — BP 124/78 | HR 88 | Ht 62.0 in | Wt 142.0 lb

## 2019-05-27 DIAGNOSIS — I10 Essential (primary) hypertension: Secondary | ICD-10-CM

## 2019-05-27 DIAGNOSIS — Z1231 Encounter for screening mammogram for malignant neoplasm of breast: Secondary | ICD-10-CM | POA: Diagnosis not present

## 2019-05-27 DIAGNOSIS — K047 Periapical abscess without sinus: Secondary | ICD-10-CM | POA: Diagnosis not present

## 2019-05-27 DIAGNOSIS — Z1211 Encounter for screening for malignant neoplasm of colon: Secondary | ICD-10-CM

## 2019-05-27 DIAGNOSIS — G4730 Sleep apnea, unspecified: Secondary | ICD-10-CM | POA: Diagnosis not present

## 2019-05-27 DIAGNOSIS — Z124 Encounter for screening for malignant neoplasm of cervix: Secondary | ICD-10-CM

## 2019-05-27 DIAGNOSIS — M5136 Other intervertebral disc degeneration, lumbar region: Secondary | ICD-10-CM

## 2019-05-27 DIAGNOSIS — M5126 Other intervertebral disc displacement, lumbar region: Secondary | ICD-10-CM | POA: Insufficient documentation

## 2019-05-27 DIAGNOSIS — Z0000 Encounter for general adult medical examination without abnormal findings: Secondary | ICD-10-CM

## 2019-05-27 DIAGNOSIS — M797 Fibromyalgia: Secondary | ICD-10-CM | POA: Insufficient documentation

## 2019-05-27 DIAGNOSIS — M76899 Other specified enthesopathies of unspecified lower limb, excluding foot: Secondary | ICD-10-CM | POA: Insufficient documentation

## 2019-05-27 LAB — POCT URINALYSIS DIPSTICK
Bilirubin, UA: NEGATIVE
Glucose, UA: NEGATIVE
Ketones, UA: NEGATIVE
Leukocytes, UA: NEGATIVE
Nitrite, UA: NEGATIVE
Protein, UA: NEGATIVE
Spec Grav, UA: 1.01 (ref 1.010–1.025)
Urobilinogen, UA: 0.2 E.U./dL
pH, UA: 6.5 (ref 5.0–8.0)

## 2019-05-27 MED ORDER — LEVONORGESTREL-ETHINYL ESTRAD 0.1-20 MG-MCG PO TABS: 1.0000 | ORAL_TABLET | Freq: Every day | ORAL | 3 refills | Status: DC

## 2019-05-27 MED ORDER — OLMESARTAN MEDOXOMIL 40 MG PO TABS: 40.0000 mg | ORAL_TABLET | Freq: Every day | ORAL | 3 refills | Status: DC

## 2019-05-27 MED ORDER — AMOXICILLIN 875 MG PO TABS: 875.0000 mg | ORAL_TABLET | Freq: Two times a day (BID) | ORAL | 0 refills | Status: AC

## 2019-05-27 MED ORDER — TRAMADOL-ACETAMINOPHEN 37.5-325 MG PO TABS: 1.0000 | ORAL_TABLET | Freq: Four times a day (QID) | ORAL | 1 refills | Status: DC | PRN

## 2019-05-27 NOTE — Progress Notes (Signed)
Date:  05/27/2019   Name:  April Fernandez   DOB:  [birth year]   MRN:  [id number]   Chief Complaint: Annual Exam (Breast exam and declined papsmear today. She said she is spotting for her cycle and wants to reschedule this to next year. ) April Fernandez is a 48 y.o. female who presents today for her Complete Annual Exam. She feels fairly well. She reports exercising very little. She reports she is sleeping fairly well.  She denies breast complaints. She does not want to do Pap today - she is going back to work today and was not prepared.  Mammogram - none Colonoscopy - none Pap - 2016 neg Thinprep Immunization History  Administered Date(s) Administered  . Hepatitis A, Adult 05/27/2008  . Influenza,inj,Quad PF,6+ Mos 03/05/2018  . Influenza-Unspecified 03/31/2019  . Tdap 12/08/2014    Hypertension This is a chronic problem. The problem is controlled. Pertinent negatives include no chest pain, headaches, palpitations or shortness of breath. Past treatments include angiotensin blockers. The current treatment provides significant improvement. There are no compliance problems.   Back Pain This is a chronic problem. The problem occurs daily. The problem is unchanged. The pain is present in the lumbar spine. Pertinent negatives include no abdominal pain, chest pain, dysuria, fever or headaches. She has tried analgesics (pain clinic evaluation, ESI, etc) for the symptoms.  Dental Pain  This is a new problem. The current episode started in the past 7 days. The problem has been unchanged. The pain is mild. Pertinent negatives include no fever. Associated symptoms comments: Bottom left molar broken - can't get in with dentist right away. She has tried NSAIDs for the symptoms. The treatment provided mild relief.    Lab Results  Component Value Date   CREATININE 0.84 10/09/2017   BUN 8 10/09/2017   NA 137 10/09/2017   K 4.8 10/09/2017   CL 102 10/09/2017   CO2 22 10/09/2017   Lab Results   Component Value Date   CHOL 215 (H) 10/09/2017   HDL 56 10/09/2017   LDLCALC 148 (H) 10/09/2017   TRIG 57 10/09/2017   CHOLHDL 3.8 10/09/2017   Lab Results  Component Value Date   TSH 1.080 10/09/2017   No results found for: HGBA1C   Review of Systems  Constitutional: Positive for fatigue (and non refreshing sleep). Negative for chills and fever.  HENT: Positive for dental problem. Negative for congestion, hearing loss, tinnitus, trouble swallowing and voice change.   Eyes: Negative for visual disturbance.  Respiratory: Negative for cough, chest tightness, shortness of breath and wheezing.   Cardiovascular: Negative for chest pain, palpitations and leg swelling.  Gastrointestinal: Negative for abdominal pain, constipation, diarrhea and vomiting.  Endocrine: Negative for polydipsia and polyuria.  Genitourinary: Negative for dysuria, frequency, genital sores, vaginal bleeding and vaginal discharge.  Musculoskeletal: Positive for back pain. Negative for arthralgias, gait problem and joint swelling.  Skin: Negative for color change and rash.  Neurological: Negative for dizziness, tremors, light-headedness and headaches.  Hematological: Negative for adenopathy. Does not bruise/bleed easily.  Psychiatric/Behavioral: Negative for dysphoric mood and sleep disturbance. The patient is not nervous/anxious.     Patient Active Problem List   Diagnosis Date Noted  . Displacement of lumbar intervertebral disc without myelopathy 05/27/2019  . Enthesopathy of hip region 05/27/2019  . Fibromyositis 05/27/2019  . Trigger finger of both hands 05/05/2015  . Essential hypertension 01/09/2015  . Degenerative disc disease, lumbar 01/09/2015  . Allergic rhinitis 01/09/2015  . Arthritis  of hand 01/09/2015  . Current tobacco use 01/09/2015  . Arthralgia of hip or thigh 08/18/2009  . Awareness of heartbeats 08/18/2009    No Known Allergies  Past Surgical History:  Procedure Laterality Date  .  lumbar ESI  2007    Social History   Tobacco Use  . Smoking status: Current Every Day Smoker    Packs/day: 0.50    Years: 34.00    Pack years: 17.00    Types: Cigarettes  . Smokeless tobacco: Never Used  . Tobacco comment: electric vape cigg   Substance Use Topics  . Alcohol use: No    Alcohol/week: 0.0 standard drinks  . Drug use: No     Medication list has been reviewed and updated.  Current Meds  Medication Sig  . olmesartan (BENICAR) 40 MG tablet TAKE 1 TABLET(40 MG) BY MOUTH DAILY  . traMADol-acetaminophen (ULTRACET) 37.5-325 MG tablet TAKE 1 TO 2 TABLETS BY MOUTH EVERY 6 HOURS AS NEEDED  . VIENVA 0.1-20 MG-MCG tablet TAKE 1 TABLET BY MOUTH ONCE DAILY    PHQ 2/9 Scores 05/27/2019 09/03/2018 10/09/2017 10/08/2016  PHQ - 2 Score 0 0 0 0  PHQ- 9 Score 0 - 3 -    BP Readings from Last 3 Encounters:  05/27/19 124/78  09/03/18 126/76  10/09/17 122/84    Physical Exam Vitals and nursing note reviewed.  Constitutional:      General: She is not in acute distress.    Appearance: She is well-developed.  HENT:     Head: Normocephalic and atraumatic.     Right Ear: Tympanic membrane and ear canal normal.     Left Ear: Tympanic membrane and ear canal normal.     Nose:     Right Sinus: No maxillary sinus tenderness.     Left Sinus: No maxillary sinus tenderness.  Eyes:     General: No scleral icterus.       Right eye: No discharge.        Left eye: No discharge.     Conjunctiva/sclera: Conjunctivae normal.  Neck:     Thyroid: No thyromegaly.     Vascular: No carotid bruit.  Cardiovascular:     Rate and Rhythm: Normal rate and regular rhythm.     Pulses: Normal pulses.     Heart sounds: Normal heart sounds.  Pulmonary:     Effort: Pulmonary effort is normal. No respiratory distress.     Breath sounds: No wheezing.  Chest:     Breasts:        Right: No mass, nipple discharge, skin change or tenderness.        Left: No mass, nipple discharge, skin change or  tenderness.  Abdominal:     General: Bowel sounds are normal.     Palpations: Abdomen is soft.     Tenderness: There is no abdominal tenderness.  Musculoskeletal:        General: Normal range of motion.     Cervical back: Normal range of motion. No erythema.  Lymphadenopathy:     Cervical: No cervical adenopathy.  Skin:    General: Skin is warm and dry.     Findings: No rash.  Neurological:     Mental Status: She is alert and oriented to person, place, and time.     Cranial Nerves: No cranial nerve deficit.     Sensory: No sensory deficit.     Deep Tendon Reflexes: Reflexes are normal and symmetric.  Psychiatric:  Speech: Speech normal.        Behavior: Behavior normal.        Thought Content: Thought content normal.     Wt Readings from Last 3 Encounters:  05/27/19 142 lb (64.4 kg)  09/03/18 155 lb (70.3 kg)  10/09/17 148 lb (67.1 kg)    BP 124/78   Pulse 88   Ht 5\' 2"  (1.575 m)   Wt 142 lb (64.4 kg)   SpO2 98%   BMI 25.97 kg/m   Assessment and Plan: 1. Annual physical exam Normal exam - encourage health diet, regular exercise - Lipid panel - POCT urinalysis dipstick - levonorgestrel-ethinyl estradiol (VIENVA) 0.1-20 MG-MCG tablet; Take 1 tablet by mouth daily.  Dispense: 3 Package; Refill: 3  2. Encounter for screening mammogram for breast cancer Pt strongly encouraged to schedule her mammogram at [facility] - MM 3D SCREEN BREAST BILATERAL; Future  3. Encounter for screening for cervical cancer  Pt declines Pap and pelvic exam today Will defer until next year  4. Essential hypertension Clinically stable exam with well controlled BP.   Tolerating medications, olmesartan 40 mg, without side effects at this time. Pt to continue current regimen and low sodium diet; benefits of regular exercise as able discussed. - CBC with Differential/Platelet - Comprehensive metabolic panel - TSH - olmesartan (BENICAR) 40 MG tablet; Take 1 tablet (40 mg total) by mouth  daily.  Dispense: 90 tablet; Refill: 3  5. Degenerative disc disease, lumbar Symptoms are slowly worsening and interfering with daily activities She continues on Tramadol 4 per day. I encourage her to follow up with Ortho for further imaging as they deem appropriate - traMADol-acetaminophen (ULTRACET) 37.5-325 MG tablet; Take 1-2 tablets by mouth every 6 (six) hours as needed.  Dispense: 240 tablet; Refill: 1  6. Colon cancer screening Due for CRC screening She is at average risk  Will refer to [facility] - Ambulatory referral to Gastroenterology  7. Dental infection - amoxicillin (AMOXIL) 875 MG tablet; Take 1 tablet (875 mg total) by mouth 2 (two) times daily for 10 days.  Dispense: 20 tablet; Refill: 0  8. Sleep-disordered breathing Had attempted to order sleep studies last year but these were never complete Epworth scale 18/24 - Ambulatory referral to Sleep Studies   Partially dictated using Dragon software. Any errors are unintentional.  02-10-1999, MD [facility]  05/27/2019

## 2019-05-28 ENCOUNTER — Encounter: Payer: Self-pay | Admitting: Internal Medicine

## 2019-05-28 DIAGNOSIS — E782 Mixed hyperlipidemia: Secondary | ICD-10-CM | POA: Insufficient documentation

## 2019-05-28 LAB — CBC WITH DIFFERENTIAL/PLATELET
Basophils Absolute: 0 10*3/uL (ref 0.0–0.2)
Basos: 0 %
EOS (ABSOLUTE): 0.1 10*3/uL (ref 0.0–0.4)
Eos: 1 %
Hematocrit: 36.3 % (ref 34.0–46.6)
Hemoglobin: 12 g/dL (ref 11.1–15.9)
Immature Grans (Abs): 0 10*3/uL (ref 0.0–0.1)
Immature Granulocytes: 0 %
Lymphocytes Absolute: 1.8 10*3/uL (ref 0.7–3.1)
Lymphs: 35 %
MCH: 30.1 pg (ref 26.6–33.0)
MCHC: 33.1 g/dL (ref 31.5–35.7)
MCV: 91 fL (ref 79–97)
Monocytes Absolute: 0.4 10*3/uL (ref 0.1–0.9)
Monocytes: 7 %
Neutrophils Absolute: 2.9 10*3/uL (ref 1.4–7.0)
Neutrophils: 57 %
Platelets: 314 10*3/uL (ref 150–450)
RBC: 3.99 x10E6/uL (ref 3.77–5.28)
RDW: 11.8 % (ref 11.7–15.4)
WBC: 5.1 10*3/uL (ref 3.4–10.8)

## 2019-05-28 LAB — COMPREHENSIVE METABOLIC PANEL
ALT: 10 IU/L (ref 0–32)
AST: 14 IU/L (ref 0–40)
Albumin/Globulin Ratio: 2 (ref 1.2–2.2)
Albumin: 4.6 g/dL (ref 3.8–4.8)
Alkaline Phosphatase: 49 IU/L (ref 39–117)
BUN/Creatinine Ratio: 8 — ABNORMAL LOW (ref 9–23)
BUN: 7 mg/dL (ref 6–24)
Bilirubin Total: 0.5 mg/dL (ref 0.0–1.2)
CO2: 21 mmol/L (ref 20–29)
Calcium: 9.7 mg/dL (ref 8.7–10.2)
Chloride: 104 mmol/L (ref 96–106)
Creatinine, Ser: 0.92 mg/dL (ref 0.57–1.00)
GFR calc Af Amer: 84 mL/min/{1.73_m2} (ref >59)
GFR calc non Af Amer: 73 mL/min/{1.73_m2} (ref >59)
Globulin, Total: 2.3 g/dL (ref 1.5–4.5)
Glucose: 90 mg/dL (ref 65–99)
Potassium: 4.5 mmol/L (ref 3.5–5.2)
Sodium: 137 mmol/L (ref 134–144)
Total Protein: 6.9 g/dL (ref 6.0–8.5)

## 2019-05-28 LAB — LIPID PANEL
Chol/HDL Ratio: 4.8 ratio — ABNORMAL HIGH (ref 0.0–4.4)
Cholesterol, Total: 220 mg/dL — ABNORMAL HIGH (ref 100–199)
HDL: 46 mg/dL (ref >39)
LDL Chol Calc (NIH): 161 mg/dL — ABNORMAL HIGH (ref 0–99)
Triglycerides: 76 mg/dL (ref 0–149)
VLDL Cholesterol Cal: 13 mg/dL (ref 5–40)

## 2019-05-28 LAB — TSH: TSH: 1.29 u[IU]/mL (ref 0.450–4.500)

## 2019-06-02 NOTE — Progress Notes (Signed)
Pt called and left msg asking that I leave a detailed message about labs on her VM. Called back and left Dr Jaclynn Guarneri note message on her VM. Asked her to callback and let us know if she is willing to try cholesterol medication and to make a 6 month follow up if so.

## 2019-06-03 NOTE — Progress Notes (Signed)
Printed and mailed labs at patients request. She said she wants to call and schedule an appt to follow up and discuss these labs in person with Dr. Judithann Graves. Will schedule appt and callback.

## 2019-06-21 ENCOUNTER — Telehealth: Payer: Self-pay

## 2019-06-21 NOTE — Telephone Encounter (Signed)
Received FAX from [facility] stating they have tried contacting pt several times to schedule colonoscopy and pt has not called back.  Called pt and left VM reminding to call [facility] back. Gave her their number to schedule an appointment asap.  Lovett Calender, CMA

## 2019-08-06 ENCOUNTER — Ambulatory Visit: Payer: BC Managed Care – PPO | Admitting: Internal Medicine

## 2019-08-06 ENCOUNTER — Encounter: Payer: Self-pay | Admitting: Internal Medicine

## 2019-08-06 ENCOUNTER — Other Ambulatory Visit: Payer: Self-pay

## 2019-08-06 VITALS — BP 120/64 | HR 98 | Ht 62.0 in | Wt 144.0 lb

## 2019-08-06 DIAGNOSIS — F17200 Nicotine dependence, unspecified, uncomplicated: Secondary | ICD-10-CM | POA: Diagnosis not present

## 2019-08-06 DIAGNOSIS — G4730 Sleep apnea, unspecified: Secondary | ICD-10-CM

## 2019-08-06 DIAGNOSIS — I10 Essential (primary) hypertension: Secondary | ICD-10-CM | POA: Diagnosis not present

## 2019-08-06 DIAGNOSIS — E782 Mixed hyperlipidemia: Secondary | ICD-10-CM | POA: Diagnosis not present

## 2019-08-06 DIAGNOSIS — Z23 Encounter for immunization: Secondary | ICD-10-CM | POA: Diagnosis not present

## 2019-08-06 DIAGNOSIS — Z1211 Encounter for screening for malignant neoplasm of colon: Secondary | ICD-10-CM

## 2019-08-06 NOTE — Progress Notes (Signed)
Date:  08/06/2019   Name:  April Fernandez   DOB:  1970-06-22   MRN:  [id number]   Chief Complaint: Follow-up (med? and shingles shot)  Hyperlipidemia This is a chronic problem. The problem is uncontrolled. Factors aggravating her hyperlipidemia include smoking. Pertinent negatives include no chest pain or shortness of breath. She is currently on no antihyperlipidemic treatment. Compliance problems: pt does not want to take medications at this time.   Hypertension This is a chronic problem. The problem is controlled. Pertinent negatives include no chest pain, headaches or shortness of breath. Past treatments include angiotensin blockers. The current treatment provides significant improvement. There are no compliance problems.   Sleep concerns - was referred to [facility] last year but now has a different insurance and wants to go to [facility] in [place]. She also wants to get the Shingrix vaccine Colonoscopy - she declines a referral at this time.  Lab Results  Component Value Date   CREATININE 0.92 05/27/2019   BUN 7 05/27/2019   NA 137 05/27/2019   K 4.5 05/27/2019   CL 104 05/27/2019   CO2 21 05/27/2019   Lab Results  Component Value Date   CHOL 220 (H) 05/27/2019   HDL 46 05/27/2019   LDLCALC 161 (H) 05/27/2019   TRIG 76 05/27/2019   CHOLHDL 4.8 (H) 05/27/2019   Lab Results  Component Value Date   TSH 1.290 05/27/2019   Epworth scale 18/24   Review of Systems  Constitutional: Positive for fatigue (and non refreshing sleep). Negative for chills, fever and unexpected weight change.  HENT: Negative for trouble swallowing.   Respiratory: Negative for chest tightness and shortness of breath.   Cardiovascular: Negative for chest pain and leg swelling.  Gastrointestinal: Negative for abdominal pain.  Musculoskeletal: Positive for back pain.  Neurological: Negative for dizziness and headaches.  Psychiatric/Behavioral: Positive for sleep disturbance. Negative for  dysphoric mood. The patient is not nervous/anxious.     Patient Active Problem List   Diagnosis Date Noted  . Mixed hyperlipidemia 05/28/2019  . Displacement of lumbar intervertebral disc without myelopathy 05/27/2019  . Enthesopathy of hip region 05/27/2019  . Fibromyositis 05/27/2019  . Trigger finger of both hands 05/05/2015  . Essential hypertension 01/09/2015  . Degenerative disc disease, lumbar 01/09/2015  . Allergic rhinitis 01/09/2015  . Arthritis of hand 01/09/2015  . Tobacco use disorder 01/09/2015  . Arthralgia of hip or thigh 08/18/2009  . Awareness of heartbeats 08/18/2009    No Known Allergies  Past Surgical History:  Procedure Laterality Date  . lumbar ESI  2007    Social History   Tobacco Use  . Smoking status: Current Every Day Smoker    Packs/day: 0.50    Years: 34.00    Pack years: 17.00    Types: Cigarettes  . Smokeless tobacco: Never Used  Substance Use Topics  . Alcohol use: No    Alcohol/week: 0.0 standard drinks  . Drug use: No     Medication list has been reviewed and updated.  Current Meds  Medication Sig  . levonorgestrel-ethinyl estradiol (VIENVA) 0.1-20 MG-MCG tablet Take 1 tablet by mouth daily.  Marland Kitchen olmesartan (BENICAR) 40 MG tablet Take 1 tablet (40 mg total) by mouth daily.  . traMADol-acetaminophen (ULTRACET) 37.5-325 MG tablet Take 1-2 tablets by mouth every 6 (six) hours as needed.    PHQ 2/9 Scores 08/06/2019 05/27/2019 09/03/2018 10/09/2017  PHQ - 2 Score 0 0 0 0  PHQ- 9 Score 0 0 - 3  BP Readings from Last 3 Encounters:  08/06/19 120/64  05/27/19 124/78  09/03/18 126/76    Physical Exam Vitals and nursing note reviewed.  Constitutional:      General: She is not in acute distress.    Appearance: Normal appearance. She is well-developed.  HENT:     Head: Normocephalic and atraumatic.  Cardiovascular:     Rate and Rhythm: Normal rate and regular rhythm.     Pulses: Normal pulses.     Heart sounds: No murmur.    Pulmonary:     Effort: Pulmonary effort is normal. No respiratory distress.  Musculoskeletal:     Cervical back: Normal range of motion.     Right lower leg: No edema.     Left lower leg: No edema.  Lymphadenopathy:     Cervical: No cervical adenopathy.  Skin:    General: Skin is warm and dry.     Capillary Refill: Capillary refill takes less than 2 seconds.     Findings: No rash.  Neurological:     General: No focal deficit present.     Mental Status: She is alert and oriented to person, place, and time.  Psychiatric:        Behavior: Behavior normal.        Thought Content: Thought content normal.     Wt Readings from Last 3 Encounters:  08/06/19 144 lb (65.3 kg)  05/27/19 142 lb (64.4 kg)  09/03/18 155 lb (70.3 kg)    BP 120/64   Pulse 98   Ht 5\' 2"  (1.575 m)   Wt 144 lb (65.3 kg)   SpO2 99%   BMI 26.34 kg/m   Assessment and Plan: 1. Mixed hyperlipidemia Does not want to start medication at this time Discussed that stopping smoking would reduce her CAD risk by half  2. Tobacco use disorder Continues to smoke She does not want assistance with quitting at this time  3. Essential hypertension Clinically stable exam with well controlled BP on benicar. Tolerating medications without side effects at this time. Pt to continue current regimen and low sodium diet; benefits of regular exercise as able discussed.  4. Colon cancer screening Pt defers colonoscopy at this time  5. Sleep-disordered breathing Will change referral to [facility] - Ambulatory referral to Sleep Studies  6. Need for shingles vaccine First dose today Schedule second dose between 2-6 months - Varicella-zoster vaccine IM   Partially dictated using Editor, commissioning. Any errors are unintentional.  Halina Maidens, MD [facility]  08/06/2019

## 2019-08-06 NOTE — Patient Instructions (Signed)
Recombinant Zoster (Shingles) Vaccine: What You Need to Know 1. Why get vaccinated? Recombinant zoster (shingles) vaccine can prevent shingles. Shingles (also called herpes zoster, or just zoster) is a painful skin rash, usually with blisters. In addition to the rash, shingles can cause fever, headache, chills, or upset stomach. More rarely, shingles can lead to pneumonia, hearing problems, blindness, brain inflammation (encephalitis), or death. The most common complication of shingles is long-term nerve pain called postherpetic neuralgia (PHN). PHN occurs in the areas where the shingles rash was, even after the rash clears up. It can last for months or years after the rash goes away. The pain from PHN can be severe and debilitating. About 10 to 18% of people who get shingles will experience PHN. The risk of PHN increases with age. An older adult with shingles is more likely to develop PHN and have longer lasting and more severe pain than a younger person with shingles. Shingles is caused by the varicella zoster virus, the same virus that causes chickenpox. After you have chickenpox, the virus stays in your body and can cause shingles later in life. Shingles cannot be passed from one person to another, but the virus that causes shingles can spread and cause chickenpox in someone who had never had chickenpox or received chickenpox vaccine. 2. Recombinant shingles vaccine Recombinant shingles vaccine provides strong protection against shingles. By preventing shingles, recombinant shingles vaccine also protects against PHN. Recombinant shingles vaccine is the preferred vaccine for the prevention of shingles. However, a different vaccine, live shingles vaccine, may be used in some circumstances. The recombinant shingles vaccine is recommended for adults 50 years and older without serious immune problems. It is given as a two-dose series. This vaccine is also recommended for people who have already gotten  another type of shingles vaccine, the live shingles vaccine. There is no live virus in this vaccine. Shingles vaccine may be given at the same time as other vaccines. 3. Talk with your health care provider Tell your vaccine provider if the person getting the vaccine:  Has had an allergic reaction after a previous dose of recombinant shingles vaccine, or has any severe, life-threatening allergies.  Is pregnant or breastfeeding.  Is currently experiencing an episode of shingles. In some cases, your health care provider may decide to postpone shingles vaccination to a future visit. People with minor illnesses, such as a cold, may be vaccinated. People who are moderately or severely ill should usually wait until they recover before getting recombinant shingles vaccine. Your health care provider can give you more information. 4. Risks of a vaccine reaction  A sore arm with mild or moderate pain is very common after recombinant shingles vaccine, affecting about 80% of vaccinated people. Redness and swelling can also happen at the site of the injection.  Tiredness, muscle pain, headache, shivering, fever, stomach pain, and nausea happen after vaccination in more than half of people who receive recombinant shingles vaccine. In clinical trials, about 1 out of 6 people who got recombinant zoster vaccine experienced side effects that prevented them from doing regular activities. Symptoms usually went away on their own in 2 to 3 days. You should still get the second dose of recombinant zoster vaccine even if you had one of these reactions after the first dose. People sometimes faint after medical procedures, including vaccination. Tell your provider if you feel dizzy or have vision changes or ringing in the ears. As with any medicine, there is a very remote chance of a vaccine causing   a severe allergic reaction, other serious injury, or death. 5. What if there is a serious problem? An allergic reaction  could occur after the vaccinated person leaves the clinic. If you see signs of a severe allergic reaction (hives, swelling of the face and throat, difficulty breathing, a fast heartbeat, dizziness, or weakness), call 9-1-1 and get the person to the nearest hospital. For other signs that concern you, call your health care provider. Adverse reactions should be reported to the Vaccine Adverse Event Reporting System (VAERS). Your health care provider will usually file this report, or you can do it yourself. Visit the VAERS website at www.vaers.hhs.gov or call 1-800-822-7967. VAERS is only for reporting reactions, and VAERS staff do not give medical advice. 6. How can I learn more?  Ask your health care provider.  Call your local or state health department.  Contact the Centers for Disease Control and Prevention (CDC): ? Call 1-800-232-4636 (1-800-CDC-INFO) or ? Visit CDC's website at www.cdc.gov/vaccines Vaccine Information Statement Recombinant Zoster Vaccine (03/25/2018) This information is not intended to replace advice given to you by your health care provider. Make sure you discuss any questions you have with your health care provider. Document Revised: 09/01/2018 Document Reviewed: 12/17/2017 Elsevier Patient Education  2020 Elsevier Inc.  

## 2019-08-31 ENCOUNTER — Other Ambulatory Visit: Payer: Self-pay | Admitting: Internal Medicine

## 2019-08-31 DIAGNOSIS — M5136 Other intervertebral disc degeneration, lumbar region: Secondary | ICD-10-CM

## 2019-09-18 DIAGNOSIS — Z23 Encounter for immunization: Secondary | ICD-10-CM | POA: Diagnosis not present

## 2019-09-20 ENCOUNTER — Telehealth: Payer: Self-pay

## 2019-09-20 NOTE — Telephone Encounter (Signed)
Called and left VM asking pt to call [facility] and schedule her colonoscopy.  CM

## 2019-10-11 ENCOUNTER — Other Ambulatory Visit: Payer: Self-pay | Admitting: Internal Medicine

## 2019-10-11 ENCOUNTER — Telehealth: Payer: Self-pay | Admitting: Internal Medicine

## 2019-10-11 DIAGNOSIS — Z0000 Encounter for general adult medical examination without abnormal findings: Secondary | ICD-10-CM

## 2019-10-11 DIAGNOSIS — M5136 Other intervertebral disc degeneration, lumbar region: Secondary | ICD-10-CM

## 2019-10-11 NOTE — Telephone Encounter (Signed)
Requested medication (s) are due for refill today - yes  Requested medication (s) are on the active medication list -yes  Future visit scheduled -yes  Last refill: 08/31/19  Notes to clinic: Request for non delegated Rx  Requested Prescriptions  Pending Prescriptions Disp Refills   traMADol-acetaminophen (ULTRACET) 37.5-325 MG tablet [Pharmacy Med Name: TRAMADOL/APAP 37.5MG /325MG  TABS] 200 tablet     Sig: TAKE 1 TO 2 TABLETS BY MOUTH EVERY 6 HOURS AS NEEDED      Not Delegated - Analgesics:  Opioid Agonist Combinations Failed - 10/11/2019  2:28 PM      Failed - This refill cannot be delegated      Failed - Urine Drug Screen completed in last 360 days.      Passed - Valid encounter within last 6 months    Recent Outpatient Visits           2 months ago Mixed hyperlipidemia   [facility] Glean Hess, MD   4 months ago Annual physical exam   [facility] Glean Hess, MD   1 year ago Acute strain of neck muscle, initial encounter   [facility] Glean Hess, MD   2 years ago Annual physical exam   [facility] Glean Hess, MD   3 years ago Annual physical exam   [facility] Glean Hess, MD       Future Appointments             In 7 months Army Melia Jesse Sans, MD [facility], [facility]             Signed Prescriptions Disp Refills   VIENVA 0.1-20 MG-MCG tablet 84 tablet 2    Sig: TAKE 1 TABLET BY MOUTH DAILY      OB/GYN:  Contraceptives Passed - 10/11/2019  2:28 PM      Passed - Last BP in normal range    BP Readings from Last 1 Encounters:  08/06/19 120/64          Passed - Valid encounter within last 12 months    Recent Outpatient Visits           2 months ago Mixed hyperlipidemia   [facility] Glean Hess, MD   4 months ago Annual physical exam   [facility] Glean Hess, MD   1 year ago Acute strain of neck muscle, initial encounter   [facility] Glean Hess, MD   2 years ago Annual physical exam   [facility] Glean Hess, MD   3 years ago Annual physical exam   [facility] Glean Hess, MD       Future Appointments             In 7 months Army Melia Jesse Sans, MD [facility], [facility]                Requested Prescriptions  Pending Prescriptions Disp Refills   traMADol-acetaminophen (ULTRACET) 37.5-325 MG tablet [Pharmacy Med Name: TRAMADOL/APAP 37.5MG /325MG  TABS] 200 tablet     Sig: TAKE 1 TO 2 TABLETS BY MOUTH EVERY 6 HOURS AS NEEDED      Not Delegated - Analgesics:  Opioid Agonist Combinations Failed - 10/11/2019  2:28 PM      Failed - This refill cannot be delegated      Failed - Urine Drug Screen completed in last 360 days.  Passed - Valid encounter within last 6 months    Recent Outpatient Visits           2 months ago Mixed hyperlipidemia   [facility] Reubin Milan, [facility]   4 months ago Annual physical exam   [facility] Reubin Milan, MD   1 year ago Acute strain of neck muscle, initial encounter   [facility] Reubin Milan, MD   2 years ago Annual physical exam   [facility] Reubin Milan, MD   3 years ago Annual physical exam   [facility] Reubin Milan, MD       Future Appointments             In 7 months Judithann Graves Nyoka Cowden, MD [facility], [facility]             Signed Prescriptions Disp Refills   VIENVA 0.1-20 MG-MCG tablet 84 tablet 2    Sig: TAKE 1 TABLET BY MOUTH DAILY      OB/GYN:  Contraceptives Passed - 10/11/2019  2:28 PM      Passed - Last BP in normal range    BP Readings from Last 1 Encounters:  08/06/19 120/64          Passed - Valid encounter within last 12 months    Recent Outpatient Visits           2 months ago Mixed hyperlipidemia   [facility] Reubin Milan, MD   4 months ago Annual physical exam   [facility] Reubin Milan, MD   1 year ago Acute strain of neck muscle, initial encounter   [facility] Reubin Milan, MD   2 years ago Annual physical exam   [facility] Reubin Milan, MD   3 years ago Annual physical exam   [facility] Reubin Milan, MD       Future Appointments             In 7 months Judithann Graves Nyoka Cowden, MD [facility], [facility]

## 2019-10-11 NOTE — Telephone Encounter (Signed)
I believe this was sent to the wrong office. Thanks

## 2019-10-11 NOTE — Telephone Encounter (Signed)
Medication Refill - Medication:  traMADol-acetaminophen (ULTRACET) 37.5-325 MG tablet   Has the patient contacted their pharmacy? Yes advised to call office. Pt states she would like this for 240 tablets instead of 200.   Preferred Pharmacy (with phone number or street name):  [facility] [phone] - [place], [place] - [address] Phone:  [phone]  Fax:  [phone]      Agent: Please be advised that RX refills may take up to 3 business days. We ask that you follow-up with your pharmacy.

## 2019-10-12 ENCOUNTER — Other Ambulatory Visit: Payer: Self-pay | Admitting: *Deleted

## 2019-10-12 DIAGNOSIS — M5136 Other intervertebral disc degeneration, lumbar region: Secondary | ICD-10-CM

## 2019-10-12 NOTE — Telephone Encounter (Signed)
I accidentally sent this to the wrong practice.  I sent it to [facility] for Dr. Judithann Graves. It's for the Ultracet.

## 2019-10-12 NOTE — Telephone Encounter (Signed)
Has to check work schedule, will call back to set up appt

## 2019-10-12 NOTE — Telephone Encounter (Signed)
I sent this yesterday.  I do not know what they are talking about.

## 2019-10-16 DIAGNOSIS — Z23 Encounter for immunization: Secondary | ICD-10-CM | POA: Diagnosis not present

## 2019-11-09 ENCOUNTER — Other Ambulatory Visit: Payer: Self-pay

## 2019-11-09 ENCOUNTER — Encounter: Payer: Self-pay | Admitting: Internal Medicine

## 2019-11-09 ENCOUNTER — Ambulatory Visit: Payer: BC Managed Care – PPO | Admitting: Internal Medicine

## 2019-11-09 VITALS — BP 128/78 | HR 74 | Temp 98.4°F | Ht 62.0 in | Wt 142.0 lb

## 2019-11-09 DIAGNOSIS — I10 Essential (primary) hypertension: Secondary | ICD-10-CM | POA: Diagnosis not present

## 2019-11-09 DIAGNOSIS — M51369 Other intervertebral disc degeneration, lumbar region without mention of lumbar back pain or lower extremity pain: Secondary | ICD-10-CM

## 2019-11-09 DIAGNOSIS — M5136 Other intervertebral disc degeneration, lumbar region: Secondary | ICD-10-CM | POA: Diagnosis not present

## 2019-11-09 DIAGNOSIS — Z113 Encounter for screening for infections with a predominantly sexual mode of transmission: Secondary | ICD-10-CM

## 2019-11-09 DIAGNOSIS — N898 Other specified noninflammatory disorders of vagina: Secondary | ICD-10-CM

## 2019-11-09 LAB — POCT WET PREP WITH KOH
KOH Prep POC: NEGATIVE
RBC Wet Prep HPF POC: 0
Trichomonas, UA: NEGATIVE
WBC Wet Prep HPF POC: 2

## 2019-11-09 MED ORDER — METRONIDAZOLE 500 MG PO TABS: 500.0000 mg | ORAL_TABLET | Freq: Two times a day (BID) | ORAL | 0 refills | Status: DC

## 2019-11-09 NOTE — Patient Instructions (Signed)
[facility] Dr. Barbie Banner for Colonoscopy -  Call him and schedule appt

## 2019-11-09 NOTE — Progress Notes (Signed)
Date:  11/09/2019   Name:  April Fernandez   DOB:  07/13/1970   MRN:  [id number]   Chief Complaint: vaginal odor (Self Swab.)  Vaginal Discharge The patient's primary symptoms include a genital odor and vaginal discharge. This is a new problem. The problem occurs constantly. The patient is experiencing no pain. Associated symptoms include back pain. Pertinent negatives include no abdominal pain, chills, constipation, diarrhea, fever or headaches. She is sexually active. No, her partner does not have an STD.  Hypertension This is a chronic problem. The problem is controlled. Pertinent negatives include no chest pain, headaches, palpitations or shortness of breath. Past treatments include angiotensin blockers. The current treatment provides significant improvement. There are no compliance problems.   Back Pain This is a chronic problem. The pain is present in the lumbar spine. The pain radiates to the right knee. The pain is mild. The pain is worse during the day. The symptoms are aggravated by bending, standing, twisting and stress. Stiffness is present in the morning. Pertinent negatives include no abdominal pain, chest pain, fever, headaches, numbness or weakness. She has tried analgesics (pain management with PT, ESI) for the symptoms. The treatment provided moderate relief.    Lab Results  Component Value Date   CREATININE 0.92 05/27/2019   BUN 7 05/27/2019   NA 137 05/27/2019   K 4.5 05/27/2019   CL 104 05/27/2019   CO2 21 05/27/2019   Lab Results  Component Value Date   CHOL 220 (H) 05/27/2019   HDL 46 05/27/2019   LDLCALC 161 (H) 05/27/2019   TRIG 76 05/27/2019   CHOLHDL 4.8 (H) 05/27/2019   Lab Results  Component Value Date   TSH 1.290 05/27/2019   No results found for: HGBA1C Lab Results  Component Value Date   WBC 5.1 05/27/2019   HGB 12.0 05/27/2019   HCT 36.3 05/27/2019   MCV 91 05/27/2019   PLT 314 05/27/2019   Lab Results  Component Value Date   ALT 10  05/27/2019   AST 14 05/27/2019   ALKPHOS 49 05/27/2019   BILITOT 0.5 05/27/2019     Review of Systems  Constitutional: Negative for chills, fatigue and fever.  Respiratory: Negative for cough, chest tightness, shortness of breath and wheezing.   Cardiovascular: Negative for chest pain, palpitations and leg swelling.  Gastrointestinal: Negative for abdominal pain, constipation and diarrhea.  Genitourinary: Positive for vaginal discharge. Negative for menstrual problem.  Musculoskeletal: Positive for back pain and myalgias (right leg pain when she lies down at night - in lower thigh and down the shin).  Neurological: Negative for dizziness, weakness, light-headedness, numbness and headaches.  Psychiatric/Behavioral: Negative for dysphoric mood and sleep disturbance. The patient is not nervous/anxious.     Patient Active Problem List   Diagnosis Date Noted  . Mixed hyperlipidemia 05/28/2019  . Displacement of lumbar intervertebral disc without myelopathy 05/27/2019  . Enthesopathy of hip region 05/27/2019  . Fibromyositis 05/27/2019  . Trigger finger of both hands 05/05/2015  . Essential hypertension 01/09/2015  . Degenerative disc disease, lumbar 01/09/2015  . Allergic rhinitis 01/09/2015  . Arthritis of hand 01/09/2015  . Tobacco use disorder 01/09/2015  . Arthralgia of hip or thigh 08/18/2009  . Awareness of heartbeats 08/18/2009    No Known Allergies  Past Surgical History:  Procedure Laterality Date  . lumbar ESI  2007    Social History   Tobacco Use  . Smoking status: Current Every Day Smoker    Packs/day: 0.50  Years: 34.00    Pack years: 17.00    Types: Cigarettes  . Smokeless tobacco: Never Used  Vaping Use  . Vaping Use: Some days  . Substances: Nicotine, Flavoring  Substance Use Topics  . Alcohol use: No    Alcohol/week: 0.0 standard drinks  . Drug use: No     Medication list has been reviewed and updated.  Current Meds  Medication Sig  .  olmesartan (BENICAR) 40 MG tablet Take 1 tablet (40 mg total) by mouth daily.  . traMADol-acetaminophen (ULTRACET) 37.5-325 MG tablet TAKE 1 TO 2 TABLETS BY MOUTH EVERY 6 HOURS AS NEEDED  . VIENVA 0.1-20 MG-MCG tablet TAKE 1 TABLET BY MOUTH DAILY    PHQ 2/9 Scores 11/09/2019 08/06/2019 05/27/2019 09/03/2018  PHQ - 2 Score 0 0 0 0  PHQ- 9 Score 0 0 0 -    GAD 7 : Generalized Anxiety Score 11/09/2019 08/06/2019  Nervous, Anxious, on Edge 0 0  Control/stop worrying 0 0  Worry too much - different things 0 0  Trouble relaxing 0 0  Restless 0 0  Easily annoyed or irritable 0 0  Afraid - awful might happen 0 0  Total GAD 7 Score 0 0  Anxiety Difficulty Not difficult at all -    BP Readings from Last 3 Encounters:  11/09/19 128/78  08/06/19 120/64  05/27/19 124/78    Physical Exam Vitals and nursing note reviewed.  Constitutional:      General: She is not in acute distress.    Appearance: Normal appearance. She is well-developed.  HENT:     Head: Normocephalic and atraumatic.  Cardiovascular:     Rate and Rhythm: Normal rate and regular rhythm.     Pulses: Normal pulses.  Pulmonary:     Effort: Pulmonary effort is normal. No respiratory distress.     Breath sounds: No wheezing or rhonchi.  Abdominal:     General: Abdomen is flat.     Palpations: Abdomen is soft.     Tenderness: There is no abdominal tenderness.  Musculoskeletal:     Cervical back: Normal range of motion.     Lumbar back: No swelling, edema or spasms. Positive right straight leg raise test. Negative left straight leg raise test.     Right hip: Normal.     Left hip: Normal.     Right knee: Normal.     Left knee: Normal.     Right lower leg: No edema.     Left lower leg: No edema.  Skin:    General: Skin is warm and dry.     Findings: No rash.  Neurological:     Mental Status: She is alert and oriented to person, place, and time.  Psychiatric:        Attention and Perception: Attention normal.        Mood  and Affect: Mood normal.     Wt Readings from Last 3 Encounters:  11/09/19 142 lb (64.4 kg)  08/06/19 144 lb (65.3 kg)  05/27/19 142 lb (64.4 kg)    BP 128/78 (BP Location: Right Arm, Patient Position: Sitting, Cuff Size: Normal)   Pulse 74   Temp 98.4 F (36.9 C) (Oral)   Ht 5\' 2"  (1.575 m)   Wt 142 lb (64.4 kg)   SpO2 100%   BMI 25.97 kg/m   Assessment and Plan: 1. Vaginal odor Due to BV - POCT Wet Prep with KOH - metroNIDAZOLE (FLAGYL) 500 MG tablet; Take 1 tablet (500  mg total) by mouth 2 (two) times daily for 7 days.  Dispense: 14 tablet; Refill: 0  2. Routine screening for STI (sexually transmitted infection) - GC/Chlamydia Probe Amp  3. Degenerative disc disease, lumbar Suspect right leg pain is sciatica On Ultracet 4-6 per day; occasionally takes up to 8 if she is very active We discussed the need to adjust her monthly Rx to meet her needs without over-prescribing. I advised her to call me about one week before she will need medication for a refill and will continue #200 per month unless we can reduce the monthly dosage.  4. Essential hypertension Clinically stable exam with well controlled BP on benicar. Tolerating medications without side effects at this time. Pt to continue current regimen and low sodium diet; benefits of regular exercise as able discussed.     Partially dictated using Animal nutritionist. Any errors are unintentional.  Bari Edward, MD [facility]  11/09/2019

## 2019-11-10 ENCOUNTER — Telehealth: Payer: Self-pay | Admitting: Internal Medicine

## 2019-11-10 NOTE — Telephone Encounter (Signed)
Called pt left VM that we have not got her labs in yet that we will call her when we get them.  KP

## 2019-11-10 NOTE — Telephone Encounter (Signed)
Copied from CRM [id number]. Topic: General - Other >> Nov 10, 2019 10:54 AM April Fernandez wrote: Reason for CRM: Pt called and is requesting to have her lab results left on her VM from yesterday. Please advise.

## 2019-11-12 LAB — GC/CHLAMYDIA PROBE AMP
Chlamydia trachomatis, NAA: NEGATIVE
Neisseria Gonorrhoeae by PCR: NEGATIVE

## 2019-11-16 ENCOUNTER — Other Ambulatory Visit: Payer: Self-pay | Admitting: Internal Medicine

## 2019-11-16 DIAGNOSIS — M5136 Other intervertebral disc degeneration, lumbar region: Secondary | ICD-10-CM

## 2019-11-16 NOTE — Telephone Encounter (Signed)
Requested medication (s) are due for refill today: yes  Requested medication (s) are on the active medication list: yes  Last refill: 10/11/19  Future visit scheduled: yes  Notes to clinic:  not delegated   Requested Prescriptions  Pending Prescriptions Disp Refills   traMADol-acetaminophen (ULTRACET) 37.5-325 MG tablet [Pharmacy Med Name: TRAMADOL/APAP 37.5MG /325MG  TABS] 200 tablet     Sig: TAKE 1 TO 2 TABLETS BY MOUTH EVERY 6 HOURS AS NEEDED      Not Delegated - Analgesics:  Opioid Agonist Combinations Failed - 11/16/2019 10:08 AM      Failed - This refill cannot be delegated      Failed - Urine Drug Screen completed in last 360 days.      Passed - Valid encounter within last 6 months    Recent Outpatient Visits           1 week ago Vaginal odor   [facility] Reubin Milan, MD   3 months ago Mixed hyperlipidemia   [facility] Reubin Milan, MD   5 months ago Annual physical exam   [facility] Reubin Milan, MD   1 year ago Acute strain of neck muscle, initial encounter   [facility] Reubin Milan, MD   2 years ago Annual physical exam   [facility] Reubin Milan, MD       Future Appointments             In 6 months Judithann Graves Nyoka Cowden, MD [facility], [facility]

## 2019-11-16 NOTE — Telephone Encounter (Signed)
Requested medication (s) are due for refill today: Yes  Requested medication (s) are on the active medication list: Yes  Last refill:  10/11/19  Future visit scheduled: Yes  Notes to clinic:  See request.    Requested Prescriptions  Pending Prescriptions Disp Refills   traMADol-acetaminophen (ULTRACET) 37.5-325 MG tablet [Pharmacy Med Name: TRAMADOL/APAP 37.5MG /325MG  TABS] 200 tablet     Sig: TAKE 1 TO 2 TABLETS BY MOUTH EVERY 6 HOURS AS NEEDED      Not Delegated - Analgesics:  Opioid Agonist Combinations Failed - 11/16/2019  2:14 PM      Failed - This refill cannot be delegated      Failed - Urine Drug Screen completed in last 360 days.      Passed - Valid encounter within last 6 months    Recent Outpatient Visits           1 week ago Vaginal odor   [facility] Reubin Milan, MD   3 months ago Mixed hyperlipidemia   [facility] Reubin Milan, MD   5 months ago Annual physical exam   [facility] Reubin Milan, MD   1 year ago Acute strain of neck muscle, initial encounter   [facility] Reubin Milan, MD   2 years ago Annual physical exam   [facility] Reubin Milan, MD       Future Appointments             In 6 months Judithann Graves Nyoka Cowden, MD [facility], [facility]

## 2019-11-16 NOTE — Telephone Encounter (Signed)
Please Advise. Last office visit 11/09/2019.

## 2019-11-17 NOTE — Telephone Encounter (Signed)
Patient calling back to check status of this request. She states she will be out of medication tomorrow.

## 2019-11-17 NOTE — Telephone Encounter (Signed)
Please Advise. Last office visit was 11/09/2019.  KP

## 2019-11-18 ENCOUNTER — Telehealth: Payer: Self-pay | Admitting: Internal Medicine

## 2019-11-18 ENCOUNTER — Other Ambulatory Visit: Payer: Self-pay

## 2019-11-18 MED ORDER — FLUCONAZOLE 150 MG PO TABS
150.0000 mg | ORAL_TABLET | Freq: Once | ORAL | 0 refills | Status: DC
Start: 2019-11-18 — End: 2023-12-19

## 2019-11-18 NOTE — Telephone Encounter (Signed)
Called pt told her I sent in diflucan if she doesn't feel better then we would have to see her. Pt verbalized understanding.  KP

## 2019-11-18 NOTE — Telephone Encounter (Signed)
Copied from CRM [id number]. Topic: General - Other >> Nov 18, 2019 11:34 AM Elliot Gault wrote: Reason for CRM: patient staes metronidazole caused a yeast infection and would like PCP to send in a rx. Patient is at work please leave a message regarding the status

## 2019-11-18 NOTE — Telephone Encounter (Signed)
Filled 200 tablets with 0 RF. Dr. Judithann Graves was out of office this week. Pt will run out of meds today.   KP

## 2019-12-27 ENCOUNTER — Other Ambulatory Visit: Payer: Self-pay | Admitting: Internal Medicine

## 2019-12-27 DIAGNOSIS — M5136 Other intervertebral disc degeneration, lumbar region: Secondary | ICD-10-CM

## 2019-12-27 NOTE — Telephone Encounter (Signed)
Please Advise. Last office visit 11/09/2019.

## 2019-12-27 NOTE — Telephone Encounter (Signed)
Medication Refill - Medication: traMADol-acetaminophen (ULTRACET) 37.5-325 MG tablet   Has the patient contacted their pharmacy? Yes.   (Agent: If no, request that the patient contact the pharmacy for the refill.) (Agent: If yes, when and what did the pharmacy advise?)  Preferred Pharmacy (with phone number or street name):  [facility] [phone] - [place], [place] - [address]  [address] [place] [address]  Phone: [phone] Fax: [phone]     Agent: Please be advised that RX refills may take up to 3 business days. We ask that you follow-up with your pharmacy.

## 2019-12-27 NOTE — Telephone Encounter (Signed)
Requested medication (s) are due for refill today:yes  Requested medication (s) are on the active medication list: yes  Last refill:  11/18/19  #200  April Fernandez  Future visit scheduled: yes  Notes to clinic: Not delegated    Requested Prescriptions  Pending Prescriptions Disp Refills   traMADol-acetaminophen (ULTRACET) 37.5-325 MG tablet 200 tablet 0    Sig: Take 1-2 tablets by mouth every 6 (six) hours as needed.      Not Delegated - Analgesics:  Opioid Agonist Combinations Failed - 12/27/2019 11:48 AM      Failed - This refill cannot be delegated      Failed - Urine Drug Screen completed in last 360 days.      Passed - Valid encounter within last 6 months    Recent Outpatient Visits           1 month ago Vaginal odor   [facility] Reubin Milan, MD   4 months ago Mixed hyperlipidemia   [facility] Reubin Milan, MD   7 months ago Annual physical exam   [facility] Reubin Milan, MD   1 year ago Acute strain of neck muscle, initial encounter   [facility] Reubin Milan, MD   2 years ago Annual physical exam   [facility] Reubin Milan, MD       Future Appointments             In 5 months Judithann Graves Nyoka Cowden, MD [facility], [facility]

## 2019-12-28 ENCOUNTER — Other Ambulatory Visit: Payer: Self-pay

## 2019-12-28 MED ORDER — TRAMADOL-ACETAMINOPHEN 37.5-325 MG PO TABS
1.0000 | ORAL_TABLET | Freq: Four times a day (QID) | ORAL | 0 refills | Status: DC | PRN
Start: 2019-12-28 — End: 2020-02-01

## 2020-01-28 ENCOUNTER — Telehealth: Payer: Self-pay | Admitting: Internal Medicine

## 2020-01-28 NOTE — Telephone Encounter (Signed)
Requested medication (s) are due for refill today: yes  Requested medication (s) are on the active medication list: yes  Last refill:  11/18/19  #200  0 refills  Future visit scheduled: yes  Notes to clinic: Not delegated    Requested Prescriptions  Pending Prescriptions Disp Refills   traMADol-acetaminophen (ULTRACET) 37.5-325 MG tablet [Pharmacy Med Name: TRAMADOL/APAP 37.5MG /325MG  TABS] 200 tablet     Sig: TAKE 1 TO 2 TABLETS BY MOUTH EVERY 6 HOURS AS NEEDED      Not Delegated - Analgesics:  Opioid Agonist Combinations Failed - 01/28/2020  3:10 PM      Failed - This refill cannot be delegated      Failed - Urine Drug Screen completed in last 360 days.      Passed - Valid encounter within last 6 months    Recent Outpatient Visits           2 months ago Vaginal odor   [facility] Reubin Milan, MD   5 months ago Mixed hyperlipidemia   [facility] Reubin Milan, MD   8 months ago Annual physical exam   [facility] Reubin Milan, MD   1 year ago Acute strain of neck muscle, initial encounter   [facility] Reubin Milan, MD   2 years ago Annual physical exam   [facility] Reubin Milan, MD       Future Appointments             In 4 months Judithann Graves Nyoka Cowden, MD [facility], [facility]

## 2020-01-28 NOTE — Telephone Encounter (Signed)
Dr. Judithann Graves is out of the office today she will not return until Tuesday, 02/01/2020.  Will route result note to [facility] Nurse Triage for follow up when patient returns call to clinic.    KP

## 2020-01-28 NOTE — Telephone Encounter (Signed)
Pt is calling and would like this medication refill today. Pt stated she has been dr berglund pt for over 20 yrs. Pt is aware it can take up to 3 business days for refill

## 2020-01-28 NOTE — Telephone Encounter (Signed)
Requested medication (s) are due for refill today: yes  Requested medication (s) are on the active medication list: yes  Last refill:  11/18/19  #200  Nani Gasser  Future visit scheduled: yes  Notes to clinic: Not delegated    Requested Prescriptions  Pending Prescriptions Disp Refills   traMADol-acetaminophen (ULTRACET) 37.5-325 MG tablet [Pharmacy Med Name: TRAMADOL/APAP 37.5MG /325MG  TABS] 200 tablet     Sig: TAKE 1 TO 2 TABLETS BY MOUTH EVERY 6 HOURS AS NEEDED      Not Delegated - Analgesics:  Opioid Agonist Combinations Failed - 01/28/2020 10:41 AM      Failed - This refill cannot be delegated      Failed - Urine Drug Screen completed in last 360 days.      Passed - Valid encounter within last 6 months    Recent Outpatient Visits           2 months ago Vaginal odor   [facility] Reubin Milan, MD   5 months ago Mixed hyperlipidemia   [facility] Reubin Milan, MD   8 months ago Annual physical exam   [facility] Reubin Milan, MD   1 year ago Acute strain of neck muscle, initial encounter   [facility] Reubin Milan, MD   2 years ago Annual physical exam   [facility] Reubin Milan, MD       Future Appointments             In 4 months Judithann Graves Nyoka Cowden, MD [facility], [facility]

## 2020-01-28 NOTE — Telephone Encounter (Signed)
Please Advise. Last office visit 11/09/2019.  KP

## 2020-02-01 ENCOUNTER — Other Ambulatory Visit: Payer: Self-pay

## 2020-02-01 NOTE — Telephone Encounter (Signed)
Pt received RF 01/29/2020.  KP

## 2020-02-22 ENCOUNTER — Telehealth: Payer: Self-pay

## 2020-02-22 NOTE — Telephone Encounter (Signed)
Lvm for patient to call back

## 2020-02-22 NOTE — Telephone Encounter (Signed)
Please call the patient and schedule her a nurse visit with Nurse B to get her 2nd shingrix vaccine as soon as possible. She was supposed to get this 2 weeks ago.  Thank you!!  CM

## 2020-02-28 ENCOUNTER — Telehealth: Payer: Self-pay

## 2020-02-28 ENCOUNTER — Other Ambulatory Visit: Payer: Self-pay | Admitting: Internal Medicine

## 2020-02-28 DIAGNOSIS — M5136 Other intervertebral disc degeneration, lumbar region: Secondary | ICD-10-CM

## 2020-02-28 MED ORDER — TRAMADOL-ACETAMINOPHEN 37.5-325 MG PO TABS: 1.0000 | ORAL_TABLET | Freq: Four times a day (QID) | ORAL | 3 refills | Status: DC | PRN

## 2020-02-28 NOTE — Telephone Encounter (Signed)
Patient is about to run out of tramadol in a week and need further refills, she mention that she usually get more than enough but she's been having to jump through hurdles between the pec and then having to keep calling in to the pharmacy.  traMADol-acetaminophen (ULTRACET) 37.5-325 MG tablet [[id number]]  [id number] - [place], [place] - [address] AT [facility] [address]  [address], [place] [address]  Phone:  [phone] Fax:  [phone]  DEA #:  [id number]

## 2020-04-14 ENCOUNTER — Encounter: Payer: BC Managed Care – PPO | Admitting: Internal Medicine

## 2020-04-17 ENCOUNTER — Encounter: Payer: Self-pay | Admitting: Internal Medicine

## 2020-04-17 ENCOUNTER — Other Ambulatory Visit: Payer: Self-pay

## 2020-04-17 ENCOUNTER — Ambulatory Visit: Payer: BC Managed Care – PPO | Admitting: Internal Medicine

## 2020-04-17 VITALS — BP 132/80 | HR 86 | Temp 98.2°F | Ht 62.0 in | Wt 142.0 lb

## 2020-04-17 DIAGNOSIS — N898 Other specified noninflammatory disorders of vagina: Secondary | ICD-10-CM | POA: Diagnosis not present

## 2020-04-17 DIAGNOSIS — Z23 Encounter for immunization: Secondary | ICD-10-CM

## 2020-04-17 DIAGNOSIS — M21619 Bunion of unspecified foot: Secondary | ICD-10-CM | POA: Diagnosis not present

## 2020-04-17 DIAGNOSIS — M5136 Other intervertebral disc degeneration, lumbar region: Secondary | ICD-10-CM

## 2020-04-17 DIAGNOSIS — I10 Essential (primary) hypertension: Secondary | ICD-10-CM | POA: Diagnosis not present

## 2020-04-17 MED ORDER — METRONIDAZOLE 500 MG PO TABS: 500.0000 mg | ORAL_TABLET | Freq: Two times a day (BID) | ORAL | 0 refills | Status: AC

## 2020-04-17 MED ORDER — OLMESARTAN MEDOXOMIL 40 MG PO TABS: 40.0000 mg | ORAL_TABLET | Freq: Every day | ORAL | 3 refills | Status: DC

## 2020-04-17 NOTE — Progress Notes (Signed)
Date:  04/17/2020   Name:  April Fernandez   DOB:  January 05, 1971   MRN:  [id number]   Chief Complaint: Foot Pain (X 2-3 months,left foot and left leg pain, pain shoots from foot to leg, constant pain )  Foot Injury  There was no injury mechanism. The pain is present in the right toes and left toes. The quality of the pain is described as aching and burning. The pain is moderate. The pain has been intermittent since onset. Associated symptoms include an inability to bear weight. She reports no foreign bodies present. The symptoms are aggravated by weight bearing. Treatments tried: takes tramadol bid for back. The treatment provided no relief.  Hypertension This is a chronic problem. The problem is controlled. Pertinent negatives include no chest pain, palpitations or shortness of breath. Past treatments include angiotensin blockers.  Back Pain This is a chronic problem. The pain is present in the lumbar spine. The pain does not radiate. Stiffness is present in the morning. Pertinent negatives include no chest pain, fever or pelvic pain. She has tried analgesics for the symptoms.  Vaginal Discharge The patient's primary symptoms include a genital odor and vaginal discharge. The patient's pertinent negatives include no genital itching, genital lesions, genital rash or pelvic pain. This is a recurrent problem. Episode frequency: after menses. Associated symptoms include back pain. Pertinent negatives include no chills or fever.    Lab Results  Component Value Date   CREATININE 0.92 05/27/2019   BUN 7 05/27/2019   NA 137 05/27/2019   K 4.5 05/27/2019   CL 104 05/27/2019   CO2 21 05/27/2019   Lab Results  Component Value Date   CHOL 220 (H) 05/27/2019   HDL 46 05/27/2019   LDLCALC 161 (H) 05/27/2019   TRIG 76 05/27/2019   CHOLHDL 4.8 (H) 05/27/2019   Lab Results  Component Value Date   TSH 1.290 05/27/2019   No results found for: HGBA1C Lab Results  Component Value Date   WBC 5.1  05/27/2019   HGB 12.0 05/27/2019   HCT 36.3 05/27/2019   MCV 91 05/27/2019   PLT 314 05/27/2019   Lab Results  Component Value Date   ALT 10 05/27/2019   AST 14 05/27/2019   ALKPHOS 49 05/27/2019   BILITOT 0.5 05/27/2019     Review of Systems  Constitutional: Negative for chills, fatigue and fever.  Respiratory: Negative for cough, chest tightness and shortness of breath.   Cardiovascular: Negative for chest pain and palpitations.  Genitourinary: Positive for vaginal discharge. Negative for pelvic pain.  Musculoskeletal: Positive for back pain, gait problem and joint swelling.  Psychiatric/Behavioral: Negative for dysphoric mood and sleep disturbance. The patient is not nervous/anxious.     Patient Active Problem List   Diagnosis Date Noted  . Mixed hyperlipidemia 05/28/2019  . Displacement of lumbar intervertebral disc without myelopathy 05/27/2019  . Enthesopathy of hip region 05/27/2019  . Fibromyositis 05/27/2019  . Trigger finger of both hands 05/05/2015  . Essential hypertension 01/09/2015  . Degenerative disc disease, lumbar 01/09/2015  . Allergic rhinitis 01/09/2015  . Arthritis of hand 01/09/2015  . Tobacco use disorder 01/09/2015  . Arthralgia of hip or thigh 08/18/2009  . Awareness of heartbeats 08/18/2009    No Known Allergies  Past Surgical History:  Procedure Laterality Date  . lumbar ESI  2007    Social History   Tobacco Use  . Smoking status: Current Every Day Smoker    Packs/day: 0.50  Years: 34.00    Pack years: 17.00    Types: Cigarettes  . Smokeless tobacco: Never Used  Vaping Use  . Vaping Use: Some days  . Substances: Nicotine, Flavoring  Substance Use Topics  . Alcohol use: No    Alcohol/week: 0.0 standard drinks  . Drug use: No     Medication list has been reviewed and updated.  Current Meds  Medication Sig  . olmesartan (BENICAR) 40 MG tablet Take 1 tablet (40 mg total) by mouth daily.  . traMADol-acetaminophen  (ULTRACET) 37.5-325 MG tablet Take 1-2 tablets by mouth every 6 (six) hours as needed.  Marland Kitchen VIENVA 0.1-20 MG-MCG tablet TAKE 1 TABLET BY MOUTH DAILY (Patient taking differently: No sig reported)    PHQ 2/9 Scores 04/17/2020 11/09/2019 08/06/2019 05/27/2019  PHQ - 2 Score 0 0 0 0  PHQ- 9 Score 0 0 0 0    GAD 7 : Generalized Anxiety Score 04/17/2020 11/09/2019 08/06/2019  Nervous, Anxious, on Edge 0 0 0  Control/stop worrying 0 0 0  Worry too much - different things 0 0 0  Trouble relaxing 0 0 0  Restless 0 0 0  Easily annoyed or irritable 0 0 0  Afraid - awful might happen 0 0 0  Total GAD 7 Score 0 0 0  Anxiety Difficulty - Not difficult at all -    BP Readings from Last 3 Encounters:  04/17/20 132/80  11/09/19 128/78  08/06/19 120/64    Physical Exam Vitals and nursing note reviewed.  Constitutional:      General: She is not in acute distress.    Appearance: Normal appearance. She is well-developed.  HENT:     Head: Normocephalic and atraumatic.  Cardiovascular:     Rate and Rhythm: Normal rate and regular rhythm.  Pulmonary:     Effort: Pulmonary effort is normal. No respiratory distress.     Breath sounds: No wheezing or rhonchi.  Musculoskeletal:        General: Normal range of motion.     Cervical back: Normal range of motion.     Right lower leg: No edema.     Left lower leg: No edema.     Right foot: Bunion present.     Left foot: Bunion present.  Skin:    General: Skin is warm and dry.     Findings: No rash.  Neurological:     Mental Status: She is alert and oriented to person, place, and time.  Psychiatric:        Behavior: Behavior normal.        Thought Content: Thought content normal.     Wt Readings from Last 3 Encounters:  04/17/20 142 lb (64.4 kg)  11/09/19 142 lb (64.4 kg)  08/06/19 144 lb (65.3 kg)    BP 132/80   Pulse 86   Temp 98.2 F (36.8 C) (Oral)   Ht 5\' 2"  (1.575 m)   Wt 142 lb (64.4 kg)   SpO2 100%   BMI 25.97 kg/m    Assessment and Plan: 1. Bunion of great toe - Ambulatory referral to Podiatry  2. Essential hypertension Clinically stable exam with well controlled BP on valsartan. Tolerating medications without side effects at this time. Pt to continue current regimen and low sodium diet; benefits of regular exercise as able discussed. - olmesartan (BENICAR) 40 MG tablet; Take 1 tablet (40 mg total) by mouth daily.  Dispense: 90 tablet; Refill: 3  3. Vaginal odor If sx continue, recommend GYN  referral - metroNIDAZOLE (FLAGYL) 500 MG tablet; Take 1 tablet (500 mg total) by mouth 2 (two) times daily for 7 days.  Dispense: 14 tablet; Refill: 0  4. Need for shingles vaccine Second dose today  5. Degenerative disc disease, lumbar Continue Tramadol Pt to monitor use and let me know how many she actually uses per month since she has not been using all of the refills.   Partially dictated using Animal nutritionist. Any errors are unintentional.  Bari Edward, MD [facility]  04/17/2020

## 2020-04-26 DIAGNOSIS — Z20822 Contact with and (suspected) exposure to covid-19: Secondary | ICD-10-CM | POA: Diagnosis not present

## 2020-05-09 ENCOUNTER — Other Ambulatory Visit: Payer: Self-pay | Admitting: Internal Medicine

## 2020-05-09 DIAGNOSIS — N898 Other specified noninflammatory disorders of vagina: Secondary | ICD-10-CM

## 2020-05-22 DIAGNOSIS — Q666 Other congenital valgus deformities of feet: Secondary | ICD-10-CM | POA: Diagnosis not present

## 2020-05-22 DIAGNOSIS — Q66211 Congenital metatarsus primus varus, right foot: Secondary | ICD-10-CM | POA: Diagnosis not present

## 2020-05-22 DIAGNOSIS — M205X2 Other deformities of toe(s) (acquired), left foot: Secondary | ICD-10-CM | POA: Diagnosis not present

## 2020-05-22 DIAGNOSIS — M2011 Hallux valgus (acquired), right foot: Secondary | ICD-10-CM | POA: Diagnosis not present

## 2020-05-22 DIAGNOSIS — M79672 Pain in left foot: Secondary | ICD-10-CM | POA: Diagnosis not present

## 2020-05-22 DIAGNOSIS — M19072 Primary osteoarthritis, left ankle and foot: Secondary | ICD-10-CM | POA: Diagnosis not present

## 2020-05-22 DIAGNOSIS — M79671 Pain in right foot: Secondary | ICD-10-CM | POA: Diagnosis not present

## 2020-05-22 DIAGNOSIS — M19071 Primary osteoarthritis, right ankle and foot: Secondary | ICD-10-CM | POA: Diagnosis not present

## 2020-05-29 ENCOUNTER — Encounter: Payer: BC Managed Care – PPO | Admitting: Internal Medicine

## 2020-05-30 ENCOUNTER — Encounter: Payer: BLUE CROSS/BLUE SHIELD | Admitting: Internal Medicine

## 2020-05-31 ENCOUNTER — Other Ambulatory Visit: Payer: Self-pay | Admitting: Internal Medicine

## 2020-05-31 DIAGNOSIS — Z0000 Encounter for general adult medical examination without abnormal findings: Secondary | ICD-10-CM

## 2020-06-02 ENCOUNTER — Other Ambulatory Visit: Payer: Self-pay | Admitting: Internal Medicine

## 2020-06-02 DIAGNOSIS — I10 Essential (primary) hypertension: Secondary | ICD-10-CM

## 2020-06-14 DIAGNOSIS — Z20822 Contact with and (suspected) exposure to covid-19: Secondary | ICD-10-CM | POA: Diagnosis not present

## 2020-07-04 DIAGNOSIS — Z23 Encounter for immunization: Secondary | ICD-10-CM | POA: Diagnosis not present

## 2020-07-18 ENCOUNTER — Other Ambulatory Visit: Payer: Self-pay | Admitting: Internal Medicine

## 2020-07-18 DIAGNOSIS — M5136 Other intervertebral disc degeneration, lumbar region: Secondary | ICD-10-CM

## 2020-10-04 ENCOUNTER — Other Ambulatory Visit: Payer: Self-pay | Admitting: Internal Medicine

## 2020-10-04 DIAGNOSIS — I10 Essential (primary) hypertension: Secondary | ICD-10-CM

## 2020-10-04 NOTE — Telephone Encounter (Signed)
Requested Prescriptions  Pending Prescriptions Disp Refills  . olmesartan (BENICAR) 40 MG tablet [Pharmacy Med Name: OLMESARTAN MEDOXOMIL 40MG  TABLETS] 90 tablet 0    Sig: TAKE 1 TABLET(40 MG) BY MOUTH DAILY     Cardiovascular:  Angiotensin Receptor Blockers Failed - 10/04/2020  3:29 AM      Failed - Cr in normal range and within 180 days    Creatinine, Ser  Date Value Ref Range Status  05/27/2019 0.92 0.57 - 1.00 mg/dL Final         Failed - K in normal range and within 180 days    Potassium  Date Value Ref Range Status  05/27/2019 4.5 3.5 - 5.2 mmol/L Final         Passed - Patient is not pregnant      Passed - Last BP in normal range    BP Readings from Last 1 Encounters:  04/17/20 132/80         Passed - Valid encounter within last 6 months    Recent Outpatient Visits          5 months ago Bunion of great toe   [facility], [place]   11 months ago Vaginal odor   [facility], [place]   1 year ago Mixed hyperlipidemia   [facility], [place]   1 year ago Annual physical exam   [facility], [place]   2 years ago Acute strain of neck muscle, initial encounter   [facility], [place]      Future Appointments            Tomorrow Reubin Milan, MD [facility]   In 1 week [facility], MD [facility]

## 2020-10-05 ENCOUNTER — Other Ambulatory Visit: Payer: Self-pay

## 2020-10-05 ENCOUNTER — Encounter: Payer: Self-pay | Admitting: Internal Medicine

## 2020-10-05 ENCOUNTER — Ambulatory Visit (INDEPENDENT_AMBULATORY_CARE_PROVIDER_SITE_OTHER): Payer: BC Managed Care – PPO | Admitting: Internal Medicine

## 2020-10-05 ENCOUNTER — Other Ambulatory Visit (HOSPITAL_COMMUNITY)
Admission: RE | Admit: 2020-10-05 | Discharge: 2020-10-05 | Disposition: A | Discharge status: Home / Self Care | Payer: BC Managed Care – PPO | Source: Ambulatory Visit | Attending: Internal Medicine | Admitting: Internal Medicine

## 2020-10-05 VITALS — BP 126/82 | HR 72 | Temp 98.0°F | Ht 62.0 in | Wt 143.0 lb

## 2020-10-05 DIAGNOSIS — B9689 Other specified bacterial agents as the cause of diseases classified elsewhere: Secondary | ICD-10-CM | POA: Diagnosis not present

## 2020-10-05 DIAGNOSIS — Z124 Encounter for screening for malignant neoplasm of cervix: Secondary | ICD-10-CM | POA: Insufficient documentation

## 2020-10-05 DIAGNOSIS — Z1211 Encounter for screening for malignant neoplasm of colon: Secondary | ICD-10-CM | POA: Diagnosis not present

## 2020-10-05 DIAGNOSIS — E782 Mixed hyperlipidemia: Secondary | ICD-10-CM | POA: Diagnosis not present

## 2020-10-05 DIAGNOSIS — M5136 Other intervertebral disc degeneration, lumbar region: Secondary | ICD-10-CM

## 2020-10-05 DIAGNOSIS — R87612 Low grade squamous intraepithelial lesion on cytologic smear of cervix (LGSIL): Secondary | ICD-10-CM | POA: Diagnosis not present

## 2020-10-05 DIAGNOSIS — Z1231 Encounter for screening mammogram for malignant neoplasm of breast: Secondary | ICD-10-CM

## 2020-10-05 DIAGNOSIS — Z0000 Encounter for general adult medical examination without abnormal findings: Secondary | ICD-10-CM | POA: Diagnosis not present

## 2020-10-05 DIAGNOSIS — Z1159 Encounter for screening for other viral diseases: Secondary | ICD-10-CM

## 2020-10-05 DIAGNOSIS — N760 Acute vaginitis: Secondary | ICD-10-CM | POA: Diagnosis not present

## 2020-10-05 LAB — POCT WET PREP WITH KOH
KOH Prep POC: NEGATIVE
RBC Wet Prep HPF POC: 0
Trichomonas, UA: NEGATIVE
WBC Wet Prep HPF POC: 5
Yeast Wet Prep HPF POC: NEGATIVE

## 2020-10-05 MED ORDER — METRONIDAZOLE 500 MG PO TABS: 500.0000 mg | ORAL_TABLET | Freq: Two times a day (BID) | ORAL | 0 refills | Status: AC

## 2020-10-05 MED ORDER — MELOXICAM 15 MG PO TABS: 15.0000 mg | ORAL_TABLET | Freq: Every day | ORAL | 0 refills | Status: DC | PRN

## 2020-10-05 NOTE — Progress Notes (Signed)
Date:  10/05/2020   Name:  April Fernandez   DOB:  11-28-1970   MRN:  [id number]   Chief Complaint: Annual Exam (Breast exam with pap)  April Fernandez is a 50 y.o. female who presents today for her Complete Annual Exam. She feels well. She reports exercising none. She reports she is sleeping well. Breast complaints none.  Mammogram: none DEXA: none Pap smear: 2016 thin prep Colonoscopy: none  Immunization History  Administered Date(s) Administered  . Hepatitis A, Adult 05/27/2008  . Influenza,inj,Quad PF,6+ Mos 03/05/2018  . Influenza-Unspecified 03/31/2019, 03/15/2020  . Moderna Sars-Covid-2 Vaccination 09/18/2019, 10/16/2019  . PFIZER(Purple Top)SARS-COV-2 Vaccination 07/04/2020  . Tdap 12/08/2014  . Zoster Recombinat (Shingrix) 08/06/2019, 04/17/2020    Hypertension This is a chronic problem. The problem is controlled. Associated symptoms include palpitations. Pertinent negatives include no chest pain, headaches or shortness of breath. Past treatments include angiotensin blockers. The current treatment provides significant improvement.    Lab Results  Component Value Date   CREATININE 0.92 05/27/2019   BUN 7 05/27/2019   NA 137 05/27/2019   K 4.5 05/27/2019   CL 104 05/27/2019   CO2 21 05/27/2019   Lab Results  Component Value Date   CHOL 220 (H) 05/27/2019   HDL 46 05/27/2019   LDLCALC 161 (H) 05/27/2019   TRIG 76 05/27/2019   CHOLHDL 4.8 (H) 05/27/2019   Lab Results  Component Value Date   TSH 1.290 05/27/2019   No results found for: HGBA1C Lab Results  Component Value Date   WBC 5.1 05/27/2019   HGB 12.0 05/27/2019   HCT 36.3 05/27/2019   MCV 91 05/27/2019   PLT 314 05/27/2019   Lab Results  Component Value Date   ALT 10 05/27/2019   AST 14 05/27/2019   ALKPHOS 49 05/27/2019   BILITOT 0.5 05/27/2019     Review of Systems  Constitutional: Negative for chills, fatigue and fever.  HENT: Negative for congestion, hearing loss, tinnitus,  trouble swallowing and voice change.   Eyes: Negative for visual disturbance.  Respiratory: Negative for cough, chest tightness, shortness of breath and wheezing.   Cardiovascular: Positive for palpitations. Negative for chest pain and leg swelling.  Gastrointestinal: Negative for abdominal pain, constipation, diarrhea and vomiting.  Endocrine: Negative for polydipsia and polyuria.  Genitourinary: Positive for vaginal discharge (and odor - recurrent). Negative for dysuria, frequency, genital sores, menstrual problem and vaginal bleeding.  Musculoskeletal: Positive for back pain and myalgias. Negative for arthralgias, gait problem and joint swelling.  Skin: Negative for color change and rash.  Allergic/Immunologic: Negative for environmental allergies.  Neurological: Negative for dizziness, tremors, light-headedness and headaches.  Hematological: Negative for adenopathy. Does not bruise/bleed easily.  Psychiatric/Behavioral: Negative for dysphoric mood and sleep disturbance. The patient is not nervous/anxious.     Patient Active Problem List   Diagnosis Date Noted  . Mixed hyperlipidemia 05/28/2019  . Displacement of lumbar intervertebral disc without myelopathy 05/27/2019  . Enthesopathy of hip region 05/27/2019  . Fibromyositis 05/27/2019  . Trigger finger of both hands 05/05/2015  . Essential hypertension 01/09/2015  . Degenerative disc disease, lumbar 01/09/2015  . Allergic rhinitis 01/09/2015  . Arthritis of hand 01/09/2015  . Tobacco use disorder 01/09/2015  . Arthralgia of hip or thigh 08/18/2009  . Awareness of heartbeats 08/18/2009    Allergies  Allergen Reactions  . Peanut Allergen Powder-Dnfp     Past Surgical History:  Procedure Laterality Date  . lumbar ESI  2007    Social  History   Tobacco Use  . Smoking status: Current Every Day Smoker    Packs/day: 0.50    Years: 34.00    Pack years: 17.00    Types: Cigarettes  . Smokeless tobacco: Never Used  Vaping  Use  . Vaping Use: Some days  . Substances: Nicotine, Flavoring  Substance Use Topics  . Alcohol use: No    Alcohol/week: 0.0 standard drinks  . Drug use: No     Medication list has been reviewed and updated.  Current Meds  Medication Sig  . olmesartan (BENICAR) 40 MG tablet TAKE 1 TABLET(40 MG) BY MOUTH DAILY  . traMADol-acetaminophen (ULTRACET) 37.5-325 MG tablet TAKE 1 TO 2 TABLETS BY MOUTH EVERY 6 HOURS AS NEEDED  . VIENVA 0.1-20 MG-MCG tablet TAKE 1 TABLET BY MOUTH DAILY (Patient taking differently: No sig reported)    PHQ 2/9 Scores 10/05/2020 04/17/2020 11/09/2019 08/06/2019  PHQ - 2 Score 0 0 0 0  PHQ- 9 Score 4 0 0 0    GAD 7 : Generalized Anxiety Score 10/05/2020 04/17/2020 11/09/2019 08/06/2019  Nervous, Anxious, on Edge 0 0 0 0  Control/stop worrying 0 0 0 0  Worry too much - different things 0 0 0 0  Trouble relaxing 0 0 0 0  Restless 0 0 0 0  Easily annoyed or irritable 0 0 0 0  Afraid - awful might happen 0 0 0 0  Total GAD 7 Score 0 0 0 0  Anxiety Difficulty - - Not difficult at all -    BP Readings from Last 3 Encounters:  10/05/20 126/82  04/17/20 132/80  11/09/19 128/78    Physical Exam Vitals and nursing note reviewed.  Constitutional:      General: She is not in acute distress.    Appearance: She is well-developed.  HENT:     Head: Normocephalic and atraumatic.     Right Ear: Tympanic membrane and ear canal normal.     Left Ear: Tympanic membrane and ear canal normal.     Nose:     Right Sinus: No maxillary sinus tenderness.     Left Sinus: No maxillary sinus tenderness.  Eyes:     General: No scleral icterus.       Right eye: No discharge.        Left eye: No discharge.     Conjunctiva/sclera: Conjunctivae normal.  Neck:     Thyroid: No thyromegaly.     Vascular: No carotid bruit.  Cardiovascular:     Rate and Rhythm: Normal rate and regular rhythm.     Pulses: Normal pulses.     Heart sounds: Normal heart sounds.  Pulmonary:      Effort: Pulmonary effort is normal. No respiratory distress.     Breath sounds: No wheezing.  Chest:  Breasts:     Right: No mass, nipple discharge, skin change or tenderness.     Left: No mass, nipple discharge, skin change or tenderness.    Abdominal:     General: Bowel sounds are normal.     Palpations: Abdomen is soft.     Tenderness: There is no abdominal tenderness.  Genitourinary:    Labia:        Right: No tenderness, lesion or injury.        Left: No tenderness, lesion or injury.      Vagina: Normal.     Cervix: Discharge and erythema (5 mm endocervical polyp) present.     Uterus: Normal.  Adnexa: Right adnexa normal and left adnexa normal.     Comments: Wet prep + Clue cells/bacteria Musculoskeletal:     Cervical back: Normal range of motion. No erythema.     Lumbar back: No tenderness or bony tenderness. Decreased range of motion. Positive left straight leg raise test. Negative right straight leg raise test.     Right lower leg: No edema.     Left lower leg: No edema.  Lymphadenopathy:     Cervical: No cervical adenopathy.  Skin:    General: Skin is warm and dry.     Capillary Refill: Capillary refill takes less than 2 seconds.     Findings: No rash.  Neurological:     General: No focal deficit present.     Mental Status: She is alert and oriented to person, place, and time.     Cranial Nerves: No cranial nerve deficit.     Sensory: No sensory deficit.     Deep Tendon Reflexes: Reflexes are normal and symmetric.  Psychiatric:        Attention and Perception: Attention normal.        Mood and Affect: Mood normal.     Wt Readings from Last 3 Encounters:  10/05/20 143 lb (64.9 kg)  04/17/20 142 lb (64.4 kg)  11/09/19 142 lb (64.4 kg)    BP 126/82   Pulse 72   Temp 98 F (36.7 C) (Oral)   Ht 5\' 2"  (1.575 m)   Wt 143 lb (64.9 kg)   SpO2 98%   BMI 26.16 kg/m   Assessment and Plan: 1. Annual physical exam Normal exam Needs to participate in  regular exercise program such as walking - CBC with Differential/Platelet - Comprehensive metabolic panel - Lipid panel - TSH - HIV Antibody (routine testing w rflx)  2. Encounter for screening mammogram for breast cancer To be scheduled at [facility]  3. Colon cancer screening - Cologuard  4. Need for hepatitis C screening test - Hepatitis C antibody  5. Encounter for screening for cervical cancer Obtained today with GC/Chlamydia testing - Cytology - PAP  6. Degenerative disc disease, lumbar Can augment with Mobic when sx flare - meloxicam (MOBIC) 15 MG tablet; Take 1 tablet (15 mg total) by mouth daily as needed for pain.  Dispense: 90 tablet; Refill: 0  7. BV (bacterial vaginosis) - metroNIDAZOLE (FLAGYL) 500 MG tablet; Take 1 tablet (500 mg total) by mouth 2 (two) times daily for 7 days.  Dispense: 14 tablet; Refill: 0 - POCT Wet Prep with KOH   Partially dictated using . Any errors are unintentional.  Animal nutritionist, MD [facility]  10/05/2020

## 2020-10-05 NOTE — Patient Instructions (Addendum)
Schedule your mammogram at [place].  Submit the Cologuard specimen AS SOON AS POSSIBLE after you receive the kit.

## 2020-10-06 LAB — HIV ANTIBODY (ROUTINE TESTING W REFLEX): HIV Screen 4th Generation wRfx: NONREACTIVE

## 2020-10-06 LAB — LIPID PANEL
Chol/HDL Ratio: 4.6 ratio — ABNORMAL HIGH (ref 0.0–4.4)
Cholesterol, Total: 241 mg/dL — ABNORMAL HIGH (ref 100–199)
HDL: 52 mg/dL (ref >39)
LDL Chol Calc (NIH): 176 mg/dL — ABNORMAL HIGH (ref 0–99)
Triglycerides: 75 mg/dL (ref 0–149)
VLDL Cholesterol Cal: 13 mg/dL (ref 5–40)

## 2020-10-06 LAB — COMPREHENSIVE METABOLIC PANEL
ALT: 9 IU/L (ref 0–32)
AST: 11 IU/L (ref 0–40)
Albumin/Globulin Ratio: 1.9 (ref 1.2–2.2)
Albumin: 4.4 g/dL (ref 3.8–4.9)
Alkaline Phosphatase: 50 IU/L (ref 44–121)
BUN/Creatinine Ratio: 7 — ABNORMAL LOW (ref 9–23)
BUN: 7 mg/dL (ref 6–24)
Bilirubin Total: 0.5 mg/dL (ref 0.0–1.2)
CO2: 25 mmol/L (ref 20–29)
Calcium: 9.7 mg/dL (ref 8.7–10.2)
Chloride: 102 mmol/L (ref 96–106)
Creatinine, Ser: 1.05 mg/dL — ABNORMAL HIGH (ref 0.57–1.00)
Globulin, Total: 2.3 g/dL (ref 1.5–4.5)
Glucose: 88 mg/dL (ref 65–99)
Potassium: 4.7 mmol/L (ref 3.5–5.2)
Sodium: 137 mmol/L (ref 134–144)
Total Protein: 6.7 g/dL (ref 6.0–8.5)
eGFR: 64 mL/min/{1.73_m2} (ref >59)

## 2020-10-06 LAB — CBC WITH DIFFERENTIAL/PLATELET
Basophils Absolute: 0 10*3/uL (ref 0.0–0.2)
Basos: 0 %
EOS (ABSOLUTE): 0.1 10*3/uL (ref 0.0–0.4)
Eos: 1 %
Hematocrit: 40.5 % (ref 34.0–46.6)
Hemoglobin: 12.7 g/dL (ref 11.1–15.9)
Immature Grans (Abs): 0 10*3/uL (ref 0.0–0.1)
Immature Granulocytes: 0 %
Lymphocytes Absolute: 1.8 10*3/uL (ref 0.7–3.1)
Lymphs: 38 %
MCH: 28.5 pg (ref 26.6–33.0)
MCHC: 31.4 g/dL — ABNORMAL LOW (ref 31.5–35.7)
MCV: 91 fL (ref 79–97)
Monocytes Absolute: 0.4 10*3/uL (ref 0.1–0.9)
Monocytes: 7 %
Neutrophils Absolute: 2.6 10*3/uL (ref 1.4–7.0)
Neutrophils: 54 %
Platelets: 291 10*3/uL (ref 150–450)
RBC: 4.46 x10E6/uL (ref 3.77–5.28)
RDW: 11.9 % (ref 11.7–15.4)
WBC: 4.9 10*3/uL (ref 3.4–10.8)

## 2020-10-06 LAB — TSH: TSH: 1.21 u[IU]/mL (ref 0.450–4.500)

## 2020-10-06 LAB — HEPATITIS C ANTIBODY: Hep C Virus Ab: 0.1 s/co ratio (ref 0.0–0.9)

## 2020-10-09 LAB — CYTOLOGY - PAP
Chlamydia: NEGATIVE
Comment: NEGATIVE
Comment: NEGATIVE
Comment: NORMAL
High risk HPV: POSITIVE — AB
Neisseria Gonorrhea: NEGATIVE

## 2020-10-16 ENCOUNTER — Other Ambulatory Visit: Payer: Self-pay | Admitting: Family Medicine

## 2020-10-16 ENCOUNTER — Ambulatory Visit: Payer: Self-pay | Admitting: *Deleted

## 2020-10-16 ENCOUNTER — Ambulatory Visit: Payer: BC Managed Care – PPO | Admitting: Internal Medicine

## 2020-10-16 DIAGNOSIS — M549 Dorsalgia, unspecified: Secondary | ICD-10-CM

## 2020-10-16 NOTE — Telephone Encounter (Addendum)
Left-sided buttock pain that radiates down the left leg and up the back since yesterday. Took Tramadol early this morning-helped some. Any movement increases pain.   Reason for Disposition . [1] Pain radiates into the thigh or further down the leg AND [2] one leg  Answer Assessment - Initial Assessment Questions 1. ONSET: "When did the pain begin?"      Yesterday 2. LOCATION: "Where does it hurt?" (upper, mid or lower back)     Entire 3. SEVERITY: "How bad is the pain?"  (e.g., Scale 1-10; mild, moderate, or severe)   - MILD (1-3): doesn't interfere with normal activities    - MODERATE (4-7): interferes with normal activities or awakens from sleep    - SEVERE (8-10): excruciating pain, unable to do any normal activities      7-8 4. PATTERN: "Is the pain constant?" (e.g., yes, no; constant, intermittent)      constant 5. RADIATION: "Does the pain shoot into your legs or elsewhere?"     Left leg 6. CAUSE:  "What do you think is causing the back pain?"      sciatica 7. BACK OVERUSE:  "Any recent lifting of heavy objects, strenuous work or exercise?"     no 8. MEDICATIONS: "What have you taken so far for the pain?" (e.g., nothing, acetaminophen, NSAIDS)     Taking Tramadol this morning.tried anti-inflammatories they do not work 9. NEUROLOGIC SYMPTOMS: "Do you have any weakness, numbness, or problems with bowel/bladder control?"     Going a little more frequently 10. OTHER SYMPTOMS: "Do you have any other symptoms?" (e.g., fever, abdominal pain, burning with urination, blood in urine)       none 11. PREGNANCY: "Is there any chance you are pregnant?" (e.g., yes, no; LMP)       na  Protocols used: BACK PAIN-A-AH

## 2020-10-16 NOTE — Telephone Encounter (Signed)
Please schedule patient this week with Dr. Ashley Royalty and have her complete X-Rays downstairs 30 minutes prior to her appointment.  X-Rays ordered.

## 2020-10-16 NOTE — Telephone Encounter (Signed)
Please advise if okay to evaluate.

## 2020-10-16 NOTE — Telephone Encounter (Signed)
Left voice mail to set up appointment with Dr Matthews  

## 2020-10-17 NOTE — Telephone Encounter (Signed)
Please try to reach out to patient again today to schedule.  Needs X-rays prior to being seen.

## 2020-10-18 NOTE — Telephone Encounter (Signed)
Left voicemail to set up appointment 

## 2020-10-20 ENCOUNTER — Other Ambulatory Visit: Payer: Self-pay | Admitting: Internal Medicine

## 2020-10-20 DIAGNOSIS — M5136 Other intervertebral disc degeneration, lumbar region: Secondary | ICD-10-CM

## 2020-10-20 NOTE — Telephone Encounter (Signed)
Please review. Last office visit 10/05/2020.  KP

## 2020-10-20 NOTE — Telephone Encounter (Signed)
Requested medications are due for refill today yes  Requested medications are on the active medication list yes  Last refill 4/28  Last visit 5/12  Future visit scheduled 09/2021  Notes to clinic Not Delegated.

## 2020-10-29 LAB — COLOGUARD: Cologuard: NEGATIVE

## 2020-11-03 ENCOUNTER — Ambulatory Visit
Admission: RE | Admit: 2020-11-03 | Discharge: 2020-11-03 | Disposition: A | Discharge status: Home / Self Care | Payer: BC Managed Care – PPO | Source: Ambulatory Visit | Attending: Family Medicine | Admitting: Family Medicine

## 2020-11-03 ENCOUNTER — Other Ambulatory Visit: Payer: Self-pay

## 2020-11-03 ENCOUNTER — Ambulatory Visit: Payer: BC Managed Care – PPO | Admitting: Family Medicine

## 2020-11-03 ENCOUNTER — Encounter: Payer: Self-pay | Admitting: Family Medicine

## 2020-11-03 ENCOUNTER — Ambulatory Visit
Admission: RE | Admit: 2020-11-03 | Discharge: 2020-11-03 | Disposition: A | Discharge status: Home / Self Care | Payer: BC Managed Care – PPO | Attending: Family Medicine | Admitting: Family Medicine

## 2020-11-03 VITALS — BP 118/72 | HR 76 | Temp 98.2°F | Ht 62.0 in | Wt 144.8 lb

## 2020-11-03 DIAGNOSIS — M549 Dorsalgia, unspecified: Secondary | ICD-10-CM

## 2020-11-03 DIAGNOSIS — I700 Atherosclerosis of aorta: Secondary | ICD-10-CM | POA: Diagnosis not present

## 2020-11-03 DIAGNOSIS — G8929 Other chronic pain: Secondary | ICD-10-CM | POA: Diagnosis not present

## 2020-11-03 DIAGNOSIS — M4726 Other spondylosis with radiculopathy, lumbar region: Secondary | ICD-10-CM

## 2020-11-03 DIAGNOSIS — M7062 Trochanteric bursitis, left hip: Secondary | ICD-10-CM

## 2020-11-03 DIAGNOSIS — M533 Sacrococcygeal disorders, not elsewhere classified: Secondary | ICD-10-CM | POA: Diagnosis not present

## 2020-11-03 DIAGNOSIS — M5136 Other intervertebral disc degeneration, lumbar region: Secondary | ICD-10-CM | POA: Diagnosis not present

## 2020-11-03 NOTE — Assessment & Plan Note (Signed)
Patient with longstanding left posterior lateral hip pain with progressive worsening.  This is in the setting of noted lumbosacral spondyloarthropathy.  Physical exam shows focal tenderness at the left greater trochanter and to a lesser extent insertional gluteus medius tendon region.  Plan for 2 weeks of meloxicam, as needed tramadol, and follow-up.  If suboptimal progress noted can consider ultrasound-guided injection.  Once clinically appropriate, home-based versus formal physical therapy to commence.

## 2020-11-03 NOTE — Assessment & Plan Note (Signed)
Patient with longstanding low back central pain that is worse with weightbearing activity, immobility, cold weather.  Recent radiographs show degenerative changes with facet sclerosis at the L4-5 and L5-S1 articulations.  Physical exam does show paraspinal tenderness, positive facet load left greater than right and positive left-sided straight leg raise, otherwise neurovascularly intact with preserved symmetric strength.  Plan for 2 weeks of the scheduled meloxicam dosing, as needed tramadol, and close follow-up to assess response.  If suboptimal response noted, can consider escalation of pharmacotherapy, can consider addressing SI joints and/or left greater trochanter with continued pharmacotherapy dosing.  Once clinically appropriate, she would benefit from home-based versus formal physical therapy

## 2020-11-03 NOTE — Patient Instructions (Signed)
-   Start meloxicam, dose once daily with food for 2 weeks - Can continue tramadol on an as-needed basis - Maintain gentle motion throughout the day to avoid excessive stiffness at the low back/hip - Activity as tolerated using low back/hip symptoms as a guide - Return for follow-up in 2 weeks, contact us for questions between now and then

## 2020-11-03 NOTE — Progress Notes (Signed)
Primary Care / Sports Medicine Office Visit  Patient Information:  Patient ID: April Fernandez, female DOB: 1970-06-20 Age: 50 y.o. MRN: [id number]   April Fernandez is a pleasant 50 y.o. female presenting with the following:  Chief Complaint  Patient presents with   New Patient (Initial Visit)   Back Pain    Chronic; history of lumbar DDD (2016) and displacement of lumbar intervertebral disc without myelopathy (2020); treatment of lumbar ESI 2007; more severe left-sided and radiates down left leg, denies numbness and tingling; cold makes pain worse; taking Tramadol-acetaminophen as needed with some relief; prescribed meloxicam recently, but not taking it currently; 8/10 pain    Review of Systems pertinent details above   Patient Active Problem List   Diagnosis Date Noted   Chronic SI joint pain 11/03/2020   Greater trochanteric bursitis of left hip 11/03/2020   Osteoarthritis of spine with radiculopathy, lumbar region 11/03/2020   Mixed hyperlipidemia 05/28/2019   Displacement of lumbar intervertebral disc without myelopathy 05/27/2019   Enthesopathy of hip region 05/27/2019   Fibromyositis 05/27/2019   Trigger finger of both hands 05/05/2015   Essential hypertension 01/09/2015   Degenerative disc disease, lumbar 01/09/2015   Allergic rhinitis 01/09/2015   Arthritis of hand 01/09/2015   Tobacco use disorder 01/09/2015   Arthralgia of hip or thigh 08/18/2009   Awareness of heartbeats 08/18/2009   Past Medical History:  Diagnosis Date   Hypertension    Outpatient Encounter Medications as of 11/03/2020  Medication Sig   olmesartan (BENICAR) 40 MG tablet TAKE 1 TABLET(40 MG) BY MOUTH DAILY   traMADol-acetaminophen (ULTRACET) 37.5-325 MG tablet TAKE 1 TO 2 TABLETS BY MOUTH EVERY 6 HOURS AS NEEDED   VIENVA 0.1-20 MG-MCG tablet TAKE 1 TABLET BY MOUTH DAILY   meloxicam (MOBIC) 15 MG tablet Take 1 tablet (15 mg total) by mouth daily as needed for pain. (Patient not taking:  Reported on 11/03/2020)   No facility-administered encounter medications on file as of 11/03/2020.   Past Surgical History:  Procedure Laterality Date   lumbar ESI  2007    Vitals:   11/03/20 0849  BP: 118/72  Pulse: 76  Temp: 98.2 F (36.8 C)  SpO2: 100%   Vitals:   11/03/20 0849  Weight: 144 lb 12.8 oz (65.7 kg)  Height: 5\' 2"  (1.575 m)   Body mass index is 26.48 kg/m.  No results found.   Independent interpretation of notes and tests performed by another provider:   Independent interpretation of lumbar films obtained on 11/03/2020 reveal scoliotic curvature at the inferior lumbar spine, focal intervertebral narrowing mild in nature at the L4-5 and L5-S1 intervertebral spaces, associated facet hypertrophy and sclerosis at the same levels, subtle anterior endplate osteophytes in the upper lumbar levels, no acute osseous process identified  Procedures performed:   None  Pertinent History, Exam, Impression, and Recommendations:   Chronic SI joint pain Longstanding low back/left hip symptoms with progressive worsening, focal tenderness to the left greater than right SI joint in the setting of lumbar spondylosis as noted on x-rays.  Plan for 2 weeks of meloxicam, patient has not dosed this yet through previous Rx, the nature of this medication and appropriate dosing discussed at length.  She can continue tramadol on a as needed basis.  She is to return for follow-up in 2 weeks for repeat clinical exam, if suboptimal response noted, can consider ultrasound-guided SI joint injections.  Once appropriate, she would benefit from home-based versus formal physical therapy.  Greater trochanteric bursitis of left hip Patient with longstanding left posterior lateral hip pain with progressive worsening.  This is in the setting of noted lumbosacral spondyloarthropathy.  Physical exam shows focal tenderness at the left greater trochanter and to a lesser extent insertional gluteus medius  tendon region.  Plan for 2 weeks of meloxicam, as needed tramadol, and follow-up.  If suboptimal progress noted can consider ultrasound-guided injection.  Once clinically appropriate, home-based versus formal physical therapy to commence.  Osteoarthritis of spine with radiculopathy, lumbar region Patient with longstanding low back central pain that is worse with weightbearing activity, immobility, cold weather.  Recent radiographs show degenerative changes with facet sclerosis at the L4-5 and L5-S1 articulations.  Physical exam does show paraspinal tenderness, positive facet load left greater than right and positive left-sided straight leg raise, otherwise neurovascularly intact with preserved symmetric strength.  Plan for 2 weeks of the scheduled meloxicam dosing, as needed tramadol, and close follow-up to assess response.  If suboptimal response noted, can consider escalation of pharmacotherapy, can consider addressing SI joints and/or left greater trochanter with continued pharmacotherapy dosing.  Once clinically appropriate, she would benefit from home-based versus formal physical therapy    Orders & Medications No orders of the defined types were placed in this encounter.  No orders of the defined types were placed in this encounter.    Return in about 2 weeks (around 11/17/2020) for follow-up, schedule 1 hour visit for possible injections.     Jerrol Banana, MD   Primary Care Sports Medicine [facility]

## 2020-11-03 NOTE — Assessment & Plan Note (Signed)
Longstanding low back/left hip symptoms with progressive worsening, focal tenderness to the left greater than right SI joint in the setting of lumbar spondylosis as noted on x-rays.  Plan for 2 weeks of meloxicam, patient has not dosed this yet through previous Rx, the nature of this medication and appropriate dosing discussed at length.  She can continue tramadol on a as needed basis.  She is to return for follow-up in 2 weeks for repeat clinical exam, if suboptimal response noted, can consider ultrasound-guided SI joint injections.  Once appropriate, she would benefit from home-based versus formal physical therapy.

## 2020-11-16 ENCOUNTER — Encounter: Payer: Self-pay | Admitting: Family Medicine

## 2020-11-16 ENCOUNTER — Ambulatory Visit: Payer: BC Managed Care – PPO | Admitting: Family Medicine

## 2020-11-16 ENCOUNTER — Other Ambulatory Visit: Payer: Self-pay

## 2020-11-16 DIAGNOSIS — M7062 Trochanteric bursitis, left hip: Secondary | ICD-10-CM | POA: Diagnosis not present

## 2020-11-16 DIAGNOSIS — G8929 Other chronic pain: Secondary | ICD-10-CM | POA: Diagnosis not present

## 2020-11-16 DIAGNOSIS — M533 Sacrococcygeal disorders, not elsewhere classified: Secondary | ICD-10-CM | POA: Diagnosis not present

## 2020-11-16 DIAGNOSIS — Z1211 Encounter for screening for malignant neoplasm of colon: Secondary | ICD-10-CM | POA: Diagnosis not present

## 2020-11-16 LAB — COLOGUARD

## 2020-11-16 NOTE — Progress Notes (Signed)
Primary Care / Sports Medicine Office Visit  Patient Information:  Patient ID: April Fernandez, female DOB: 12-24-1970 Age: 50 y.o. MRN: [id number]   April Fernandez is a pleasant 50 y.o. female presenting with the following:  Chief Complaint  Patient presents with   Follow-up   Greater trochanteric bursitis of left hip    Taking meloxicam and Tramadol with relief; performing gentle motions to help with stiffness; 8/10 pain   Back Pain    Taking meloxicam with relief; 3/10 pain    Review of Systems pertinent details above   Patient Active Problem List   Diagnosis Date Noted   Chronic SI joint pain 11/03/2020   Greater trochanteric bursitis of left hip 11/03/2020   Osteoarthritis of spine with radiculopathy, lumbar region 11/03/2020   Mixed hyperlipidemia 05/28/2019   Displacement of lumbar intervertebral disc without myelopathy 05/27/2019   Enthesopathy of hip region 05/27/2019   Fibromyositis 05/27/2019   Trigger finger of both hands 05/05/2015   Essential hypertension 01/09/2015   Degenerative disc disease, lumbar 01/09/2015   Allergic rhinitis 01/09/2015   Arthritis of hand 01/09/2015   Tobacco use disorder 01/09/2015   Arthralgia of hip or thigh 08/18/2009   Awareness of heartbeats 08/18/2009   Past Medical History:  Diagnosis Date   Hypertension    Outpatient Encounter Medications as of 11/16/2020  Medication Sig   meloxicam (MOBIC) 15 MG tablet Take 1 tablet (15 mg total) by mouth daily as needed for pain.   olmesartan (BENICAR) 40 MG tablet TAKE 1 TABLET(40 MG) BY MOUTH DAILY   traMADol-acetaminophen (ULTRACET) 37.5-325 MG tablet TAKE 1 TO 2 TABLETS BY MOUTH EVERY 6 HOURS AS NEEDED   VIENVA 0.1-20 MG-MCG tablet TAKE 1 TABLET BY MOUTH DAILY   No facility-administered encounter medications on file as of 11/16/2020.   Past Surgical History:  Procedure Laterality Date   lumbar ESI  2007    Vitals:   11/16/20 1045  BP: 116/72  Pulse: 72  Temp: 98.2 F  (36.8 C)  SpO2: 100%   Vitals:   11/16/20 1045  Weight: 144 lb 12.8 oz (65.7 kg)  Height: 5\' 2"  (1.575 m)   Body mass index is 26.48 kg/m.  DG Lumbar Spine Complete  Result Date: 11/05/2020 CLINICAL DATA:  Sciatica. EXAM: LUMBAR SPINE - COMPLETE 4+ VIEW COMPARISON:  None. FINDINGS: Severe fecal loading throughout the colon. No fracture or traumatic malalignment. Mild degenerative disc disease at a few levels with small anterior osteophytes. Probable mild lower lumbar facet degenerative changes. Mild calcified atherosclerosis in the abdominal aorta. IMPRESSION: 1. Severe fecal loading throughout the colon. 2. Mild degenerative disc disease. Suggested mild lower lumbar facet degenerative changes. 3. Mild calcified atherosclerosis in the abdominal aorta. Electronically Signed   By: 01/05/2021 III M.D   On: 11/05/2020 22:40     Independent interpretation of notes and tests performed by another provider:   None  Procedures performed:   None  Pertinent History, Exam, Impression, and Recommendations:   Greater trochanteric bursitis of left hip Patient has demonstrated interval stability/improvement at the left greater trochanter those this is still the primary area of symptomatology.  She has focal tenderness there.  Minimally tender along the gluteus medius/minimum tendons at the insertion.  At this stage I have advised continued scheduled meloxicam, as needed tramadol, these are recommended given the interval improvement noted for this chronic issue.  She is to return for follow-up in 1 week for repeat evaluation, can discuss continued medication management,  initiation of rehab, or local injection pending findings at that time.  Chronic SI joint pain Patient with chronic right greater than left SI joint pain in the setting of known lumbosacral spondyloarthropathy.  She has shown positive response to scheduled meloxicam and tramadol on a as needed basis.  Her physical exam findings  today do reveal positive right greater than left axial facet load manifesting at the thoracolumbar spine, focal tenderness at the right SI joint that is shown interval improvement/stability, left SI joint is essentially nontender.  As such she plans to return for follow-up in 1 week for reevaluation.  If continued improvement noted, can discuss weaning from medications, start of formal versus home-based rehab, if limited improvement can consider local injections.  Additionally, given her thoracolumbar findings I have advised her to reach out to her ergonomics department at her employer [employer], she would benefit from lumbar support chair, we can fill out the corresponding paperwork once available.    Orders & Medications No orders of the defined types were placed in this encounter.  No orders of the defined types were placed in this encounter.    Return in about 1 week (around 11/23/2020), or Can be next Wed, Thurs, Fri for possible injection 40 minutes.     Jerrol Banana, MD   Primary Care Sports Medicine [facility]

## 2020-11-16 NOTE — Assessment & Plan Note (Signed)
Patient with chronic right greater than left SI joint pain in the setting of known lumbosacral spondyloarthropathy.  She has shown positive response to scheduled meloxicam and tramadol on a as needed basis.  Her physical exam findings today do reveal positive right greater than left axial facet load manifesting at the thoracolumbar spine, focal tenderness at the right SI joint that is shown interval improvement/stability, left SI joint is essentially nontender.  As such she plans to return for follow-up in 1 week for reevaluation.  If continued improvement noted, can discuss weaning from medications, start of formal versus home-based rehab, if limited improvement can consider local injections.  Additionally, given her thoracolumbar findings I have advised her to reach out to her ergonomics department at her employer [employer], she would benefit from lumbar support chair, we can fill out the corresponding paperwork once available.

## 2020-11-16 NOTE — Assessment & Plan Note (Signed)
Patient has demonstrated interval stability/improvement at the left greater trochanter those this is still the primary area of symptomatology.  She has focal tenderness there.  Minimally tender along the gluteus medius/minimum tendons at the insertion.  At this stage I have advised continued scheduled meloxicam, as needed tramadol, these are recommended given the interval improvement noted for this chronic issue.  She is to return for follow-up in 1 week for repeat evaluation, can discuss continued medication management, initiation of rehab, or local injection pending findings at that time.

## 2020-11-16 NOTE — Patient Instructions (Signed)
-   Continue with daily meloxicam (take with food) - Can also continue tramadol on a as needed basis (records checked, there are refills pending) - Talk to [facility] ergonomics department about lumbar support chair, bring any associated paperwork with you to follow-up - Return for follow-up in 1 week

## 2020-11-24 ENCOUNTER — Ambulatory Visit: Payer: BC Managed Care – PPO | Admitting: Family Medicine

## 2020-11-24 LAB — COLOGUARD: COLOGUARD: NEGATIVE

## 2020-11-29 ENCOUNTER — Telehealth: Payer: Self-pay

## 2020-11-29 ENCOUNTER — Ambulatory Visit: Payer: BC Managed Care – PPO | Admitting: Family Medicine

## 2020-11-29 ENCOUNTER — Other Ambulatory Visit: Payer: Self-pay

## 2020-11-29 ENCOUNTER — Encounter: Payer: Self-pay | Admitting: Internal Medicine

## 2020-11-29 ENCOUNTER — Encounter: Payer: Self-pay | Admitting: Family Medicine

## 2020-11-29 VITALS — BP 134/88 | HR 88 | Ht 62.0 in | Wt 145.0 lb

## 2020-11-29 DIAGNOSIS — M533 Sacrococcygeal disorders, not elsewhere classified: Secondary | ICD-10-CM | POA: Diagnosis not present

## 2020-11-29 DIAGNOSIS — M7062 Trochanteric bursitis, left hip: Secondary | ICD-10-CM | POA: Diagnosis not present

## 2020-11-29 DIAGNOSIS — G8929 Other chronic pain: Secondary | ICD-10-CM

## 2020-11-29 NOTE — Assessment & Plan Note (Signed)
This is a chronic condition that is yet to be controlled and is symptomatic beyond patient's expectation. This has been despite current medication dosing regimen of meloxicam and tramadol. She is open to ultrasound guided injections but due to work obligations tomorrow she is wishing to consider this at a later date. Given the current symptomatology and findings on exam of persistent tenderness focally at the left greater trochanter, left SI joint, and throughout the gluteus medius distribution on the left, I have advised physical therapy, continued medication management, and follow-up once PT has been scheduled. At that time we can assess the need for possible further management.

## 2020-11-29 NOTE — Telephone Encounter (Signed)
Patient informed of results at her appt today with Dr Ashley Royalty.

## 2020-11-29 NOTE — Patient Instructions (Signed)
-   Contact physical therapy below and start visits - Once visits are scheduled, contact our office to coordinate cortisone injections - Continue with current medications. - Perform activity as tolerated - Contact for any questions  [facility]:  [place]:  [phone]

## 2020-11-29 NOTE — Telephone Encounter (Signed)
-----   Message from Reubin Milan, MD sent at 11/29/2020 10:17 AM EDT ----- Let patient know that Cologuard is negative and make sure it abstracts to [facility] ----- Message ----- From: Candelaria Celeste Sent: 11/29/2020   9:24 AM EDT To: Reubin Milan, MD

## 2020-11-29 NOTE — Assessment & Plan Note (Signed)
See greater trochanteric bursitis information for plan.

## 2020-11-29 NOTE — Progress Notes (Signed)
Primary Care / Sports Medicine Office Visit  Patient Information:  Patient ID: April Fernandez, female DOB: Apr 20, 1971 Age: 50 y.o. MRN: [id number]   April Fernandez is a pleasant 50 y.o. female presenting with the following:  Chief Complaint  Patient presents with   Follow-up   Greater trochanteric bursitis of left hip    Discuss possible injections today in office; constant, dull, ache; taking Tramadol and Meloxicam with some relief; 5/10 pain   Chronic SI joint pain    Meloxicam is relieving pain per patient; 5/10 pain in office    Review of Systems pertinent details above   Patient Active Problem List   Diagnosis Date Noted   Chronic SI joint pain 11/03/2020   Greater trochanteric bursitis of left hip 11/03/2020   Osteoarthritis of spine with radiculopathy, lumbar region 11/03/2020   Mixed hyperlipidemia 05/28/2019   Displacement of lumbar intervertebral disc without myelopathy 05/27/2019   Enthesopathy of hip region 05/27/2019   Fibromyositis 05/27/2019   Trigger finger of both hands 05/05/2015   Essential hypertension 01/09/2015   Degenerative disc disease, lumbar 01/09/2015   Allergic rhinitis 01/09/2015   Arthritis of hand 01/09/2015   Tobacco use disorder 01/09/2015   Arthralgia of hip or thigh 08/18/2009   Awareness of heartbeats 08/18/2009   Past Medical History:  Diagnosis Date   Hypertension    Outpatient Encounter Medications as of 11/29/2020  Medication Sig   meloxicam (MOBIC) 15 MG tablet Take 1 tablet (15 mg total) by mouth daily as needed for pain.   olmesartan (BENICAR) 40 MG tablet TAKE 1 TABLET(40 MG) BY MOUTH DAILY   traMADol-acetaminophen (ULTRACET) 37.5-325 MG tablet TAKE 1 TO 2 TABLETS BY MOUTH EVERY 6 HOURS AS NEEDED   VIENVA 0.1-20 MG-MCG tablet TAKE 1 TABLET BY MOUTH DAILY   No facility-administered encounter medications on file as of 11/29/2020.   Past Surgical History:  Procedure Laterality Date   lumbar ESI  2007    Vitals:    11/29/20 1118  BP: 134/88  Pulse: 88  SpO2: 100%   Vitals:   11/29/20 1118  Weight: 145 lb (65.8 kg)  Height: 5\' 2"  (1.575 m)   Body mass index is 26.52 kg/m.  DG Lumbar Spine Complete  Result Date: 11/05/2020 CLINICAL DATA:  Sciatica. EXAM: LUMBAR SPINE - COMPLETE 4+ VIEW COMPARISON:  None. FINDINGS: Severe fecal loading throughout the colon. No fracture or traumatic malalignment. Mild degenerative disc disease at a few levels with small anterior osteophytes. Probable mild lower lumbar facet degenerative changes. Mild calcified atherosclerosis in the abdominal aorta. IMPRESSION: 1. Severe fecal loading throughout the colon. 2. Mild degenerative disc disease. Suggested mild lower lumbar facet degenerative changes. 3. Mild calcified atherosclerosis in the abdominal aorta. Electronically Signed   By: 01/05/2021 III M.D   On: 11/05/2020 22:40     Independent interpretation of notes and tests performed by another provider:   None  Procedures performed:   None  Pertinent History, Exam, Impression, and Recommendations:   Greater trochanteric bursitis of left hip This is a chronic condition that is yet to be controlled and is symptomatic beyond patient's expectation. This has been despite current medication dosing regimen of meloxicam and tramadol. She is open to ultrasound guided injections but due to work obligations tomorrow she is wishing to consider this at a later date. Given the current symptomatology and findings on exam of persistent tenderness focally at the left greater trochanter, left SI joint, and throughout the gluteus medius distribution  on the left, I have advised physical therapy, continued medication management, and follow-up once PT has been scheduled. At that time we can assess the need for possible further management.  Chronic SI joint pain See greater trochanteric bursitis information for plan.   Orders & Medications No orders of the defined types were placed in  this encounter.  Orders Placed This Encounter  Procedures   Ambulatory referral to Physical Therapy     Return if symptoms worsen or fail to improve.     Jerrol Banana, MD   Primary Care Sports Medicine [facility]

## 2020-12-14 ENCOUNTER — Ambulatory Visit: Payer: BC Managed Care – PPO | Attending: Physical Therapy | Admitting: Physical Therapy

## 2020-12-18 ENCOUNTER — Ambulatory Visit: Payer: BC Managed Care – PPO | Admitting: Physical Therapy

## 2020-12-20 ENCOUNTER — Encounter: Payer: BC Managed Care – PPO | Admitting: Physical Therapy

## 2020-12-25 ENCOUNTER — Encounter: Payer: BC Managed Care – PPO | Admitting: Physical Therapy

## 2020-12-27 ENCOUNTER — Encounter: Payer: BC Managed Care – PPO | Admitting: Physical Therapy

## 2021-01-01 ENCOUNTER — Other Ambulatory Visit: Payer: Self-pay | Admitting: Internal Medicine

## 2021-01-01 ENCOUNTER — Encounter: Payer: BC Managed Care – PPO | Admitting: Physical Therapy

## 2021-01-01 DIAGNOSIS — I10 Essential (primary) hypertension: Secondary | ICD-10-CM

## 2021-01-02 ENCOUNTER — Ambulatory Visit: Payer: BC Managed Care – PPO | Admitting: Physical Therapy

## 2021-01-03 ENCOUNTER — Encounter: Payer: BC Managed Care – PPO | Admitting: Physical Therapy

## 2021-01-08 ENCOUNTER — Encounter: Payer: BC Managed Care – PPO | Admitting: Physical Therapy

## 2021-01-11 ENCOUNTER — Encounter: Payer: BC Managed Care – PPO | Admitting: Physical Therapy

## 2021-01-15 ENCOUNTER — Encounter: Payer: BC Managed Care – PPO | Admitting: Physical Therapy

## 2021-01-17 ENCOUNTER — Encounter: Payer: BC Managed Care – PPO | Admitting: Physical Therapy

## 2021-01-22 ENCOUNTER — Encounter: Payer: BC Managed Care – PPO | Admitting: Physical Therapy

## 2021-01-23 ENCOUNTER — Other Ambulatory Visit: Payer: Self-pay | Admitting: Internal Medicine

## 2021-01-23 DIAGNOSIS — M5136 Other intervertebral disc degeneration, lumbar region: Secondary | ICD-10-CM

## 2021-01-23 NOTE — Telephone Encounter (Signed)
Requested medications are due for refill today.  yes  Requested medications are on the active medications list.  yes  Last refill. 10/20/2020  Future visit scheduled.   yes  Notes to clinic.  Medication not delegated.

## 2021-01-24 ENCOUNTER — Encounter: Payer: BC Managed Care – PPO | Admitting: Physical Therapy

## 2021-01-24 NOTE — Telephone Encounter (Signed)
Left voicemail to have patient call back to set up appointment. 

## 2021-02-06 ENCOUNTER — Other Ambulatory Visit: Payer: Self-pay | Admitting: Internal Medicine

## 2021-02-06 DIAGNOSIS — M5136 Other intervertebral disc degeneration, lumbar region: Secondary | ICD-10-CM

## 2021-02-06 NOTE — Telephone Encounter (Signed)
Lab results reviewed by provider- responded as normal- Rx refilled

## 2021-03-11 ENCOUNTER — Other Ambulatory Visit: Payer: Self-pay | Admitting: Internal Medicine

## 2021-03-11 DIAGNOSIS — Z0000 Encounter for general adult medical examination without abnormal findings: Secondary | ICD-10-CM

## 2021-03-11 NOTE — Telephone Encounter (Signed)
Requested Prescriptions  Pending Prescriptions Disp Refills  . VIENVA 0.1-20 MG-MCG tablet [Pharmacy Med Name: VIENVA 0.1MG /0.02MG  TABS 28S] 84 tablet 2    Sig: TAKE 1 TABLET BY MOUTH DAILY     OB/GYN:  Contraceptives Passed - 03/11/2021  3:31 AM      Passed - Last BP in normal range    BP Readings from Last 1 Encounters:  11/29/20 134/88         Passed - Valid encounter within last 12 months    Recent Outpatient Visits          3 months ago Chronic SI joint pain   [facility] Jerrol Banana, MD   3 months ago Greater trochanteric bursitis of left hip   [facility] Jerrol Banana, MD   4 months ago Greater trochanteric bursitis of left hip   [facility] Jerrol Banana, MD   5 months ago Annual physical exam   [facility] Reubin Milan, MD   10 months ago Bunion of great toe   [facility] Reubin Milan, MD      Future Appointments            In 7 months Judithann Graves, Nyoka Cowden, MD [facility]

## 2021-03-22 ENCOUNTER — Telehealth: Payer: Self-pay

## 2021-03-22 NOTE — Telephone Encounter (Signed)
Copied from CRM [id number]. Topic: General - Inquiry >> Mar 22, 2021 11:57 AM Aretta Nip wrote: Reason for CRM: Pt has called to make sure if all the drs take [employer], told pt that in order to have an absolute confirmation our advice is to call the insurance company. 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

## 2021-03-28 ENCOUNTER — Other Ambulatory Visit: Payer: Self-pay

## 2021-03-28 ENCOUNTER — Encounter: Payer: Self-pay | Admitting: Family Medicine

## 2021-03-28 ENCOUNTER — Encounter: Payer: Self-pay | Admitting: Physical Therapy

## 2021-03-28 ENCOUNTER — Ambulatory Visit: Payer: BC Managed Care – PPO | Admitting: Family Medicine

## 2021-03-28 ENCOUNTER — Ambulatory Visit: Payer: BC Managed Care – PPO | Attending: Family Medicine | Admitting: Physical Therapy

## 2021-03-28 VITALS — BP 164/90 | HR 70 | Ht 62.0 in | Wt 144.0 lb

## 2021-03-28 DIAGNOSIS — G8929 Other chronic pain: Secondary | ICD-10-CM | POA: Diagnosis not present

## 2021-03-28 DIAGNOSIS — M25552 Pain in left hip: Secondary | ICD-10-CM | POA: Insufficient documentation

## 2021-03-28 DIAGNOSIS — M533 Sacrococcygeal disorders, not elsewhere classified: Secondary | ICD-10-CM | POA: Diagnosis not present

## 2021-03-28 DIAGNOSIS — M7062 Trochanteric bursitis, left hip: Secondary | ICD-10-CM | POA: Insufficient documentation

## 2021-03-28 DIAGNOSIS — M6281 Muscle weakness (generalized): Secondary | ICD-10-CM | POA: Insufficient documentation

## 2021-03-28 MED ORDER — DICLOFENAC SODIUM 75 MG PO TBEC: 75.0000 mg | DELAYED_RELEASE_TABLET | Freq: Two times a day (BID) | ORAL | 0 refills | Status: AC | PRN

## 2021-03-28 MED ORDER — CYCLOBENZAPRINE HCL 5 MG PO TABS: 5.0000 mg | ORAL_TABLET | Freq: Every evening | ORAL | 0 refills | Status: AC | PRN

## 2021-03-28 NOTE — Assessment & Plan Note (Signed)
Patient with chronic left lateral hip pain, previous evaluate for greater trochanteric bursitis.  Examination today reveals focal tenderness at the greater trochanteric region on the left, right greater than left SI joints, gluteus medius tendon distribution, deep palpation at the piriformis, and positive Ober's testing decreased IT band flexibility.  No radicular features elicited on examination.  Patient has findings consistent with underlying primary greater trochanteric pain syndrome with secondary/compensatory features throughout her deep hip/core musculature and left leg.  We revisited treatment options from prior visits and she is amenable to formal physical therapy, has first visit scheduled later today.  I have advised a transition from scheduled meloxicam to as needed diclofenac, nightly cyclobenzaprine as needed, continue previously prescribed tramadol.  We will coordinate follow-up in 6 weeks.  If suboptimal progress noted, or pain that prevents participation PT, sooner visit or corticosteroid injections can be considered.

## 2021-03-28 NOTE — Therapy (Signed)
[facility] [address] [place], [place], [address] Phone: [phone]   Fax:  [phone]  Physical Therapy Evaluation  Patient Details  Name: April Fernandez MRN: [id number] Date of Birth: 03-17-1971 Referring Provider (PT): Joseph Berkshire   Encounter Date: 03/28/2021   PT End of Session - 03/28/21 0953     Visit Number 1    Number of Visits 12    Date for PT Re-Evaluation 05/09/21    Authorization Type IE 03/28/2021    PT Start Time 0955    PT Stop Time 1030    PT Time Calculation (min) 35 min    Activity Tolerance Patient tolerated treatment well    Behavior During Therapy [facility] for tasks assessed/performed             Past Medical History:  Diagnosis Date   Hypertension     Past Surgical History:  Procedure Laterality Date   lumbar ESI  2007    There were no vitals filed for this visit.    Subjective Assessment - 03/28/21 0954     Subjective Patient presents to clinic ~20 minutes late for evaluation check-in. Evaluation limited 2/2 to time constraints. 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 Patient has participated in PT previously and found it not helpful, notes the PT including a lot of lateral hip strengthening and lumbar stretches. Patient has sedentary job. Lowest pain is 2/10 after medication with rest. Worst pain 10/10. Patient notes pain is worst in the morning as well as occasional/rare "giving way".    Limitations Sitting;Lifting;Standing;Walking    How long can you sit comfortably? unable to sit comfortably    How long can you stand comfortably? dependent on activity, but up to an hour    How long can you walk comfortably? 15-20 minutes    Currently in Pain? Yes    Pain Score 6     Pain Location Hip    Pain Orientation Left    Pain Descriptors /  Indicators Aching    Pain Type Chronic pain    Pain Onset More than a month ago    Aggravating Factors  cold weather/inclement weather, standing    Pain Relieving Factors medication; heating pad                [facility] PT Assessment - 03/28/21 0001       Assessment   Medical Diagnosis Chronic SIJ pain, chronic GT bursitis of L hip    Referring Provider (PT) Joseph Berkshire    Hand Dominance Right    Next MD Visit 05/10/2021    Prior Therapy Yes; not helpful      Balance Screen   Has the patient fallen in the past 6 months No              SCREENING Red Flags: None Have you had any night sweats? Unexplained weight loss? Saddle anesthesia? Unexplained changes in bowel or bladder habits?  Mental Status Patient is oriented to person, place and time.  Recent memory is intact.  Remote memory is intact.  Attention span and concentration are intact.  Expressive speech is intact.  Patient's fund of knowledge is within normal limits for educational level.  POSTURE/OBSERVATIONS:  Lumbar lordosis: mildly increased Iliac crest height: L elevated Antalgic seated posture; noticeable R  thoracic rotation in sitting and standing postures.   GAIT: Limited hip extension B, L>R. Limited stance time on L.  Trendelenburg R: Positive L: Positive  RANGE OF MOTION: deferred 2/2 to time constraints and pain irritability   LEFT RIGHT  Hip Flexion (125):      Hip IR (45):     Hip ER (45):     Hip Abduction (40):     Hip extension (15):     Knee flexion (135):     Knee extension (0):     Plantarflexion (20-50):     Dorsiflexion (0-20):     Ankle inversion (30):     Ankle eversion (15):       STRENGTH: MMT   RLE LLE  Hip Abduction (seated) 5 3+  Hip Adduction (seated) 5 3+  Hip ER  4 3*  Hip IR  4 3+  Knee Extension 4 3+  Knee Flexion 4 3  Dorsiflexion  4 3  Plantarflexion (seated) 4 4     SPECIAL TESTS FABER (SN 81): R: Negative L: Positive FADIR (SN 94): R:  Negative L: Positive   FUNCTIONAL TESTS: Sit to stand: WFL, able to perform independently, but guarded and cautious throughout    ASSESSMENT Patient is a 50 year old presenting to clinic with chief complaints of chronic L hip pain. Upon examination, patient demonstrates deficits in strength, posture, AROM, and pain as evidenced by 10/10 worst pain, 7/10 pain with ~15 degrees of L hip ER, grossly 3/5 MMT LLE strength, elevated L IC and R thoracic rotation in seated and standing postures. Patient's responses on FOTO outcome measures (52) indicate significant functional limitations/disability/distress. Patient's progress may be limited due to persistence of state and previous non-responsiveness to physical therapy; however, patient's motivation is advantageous. Patient was able to achieve basic morning stretches (access code: [email or web address]) without irritation during today's evaluation and responded positively to heel lift on R for improved pelvic balance/symmetry. Patient will benefit from continued skilled therapeutic intervention to address deficits in strength, posture, AROM, and pain in order to increase function and improve overall QOL.  EDUCATION Patient educated on prognosis, POC, and provided with HEP including: supine lower trunk rotations, single knee to chest, graded exposure to Hip ER. Patient articulated understanding and returned demonstration. Patient will benefit from further education in order to maximize compliance and understanding for long-term therapeutic gains.      Objective measurements completed on examination: See above findings.                     PT Long Term Goals - 03/28/21 1648       PT LONG TERM GOAL #1   Title Patient will be independent with HEP in order to decrease pain and increase strength in order to improve function at home and work.    Baseline IE: [id number]    Time 6    Period Weeks    Status New    Target Date 05/09/21      PT LONG TERM  GOAL #2   Title Patient will demonstrate improved function as evidenced by a score of 66 on FOTO measure for full participation in activities at home and in the community.    Baseline IE: 52    Time 6    Period Weeks    Status New    Target Date 05/09/21      PT LONG TERM GOAL #3   Title Patient will be able to walk > 30 minutes  with pain controlled to a 3/10 or less on NPRS in order to participate in basic wellness and community activities.    Baseline IE: 15-20 min, 6+/10    Time 6    Period Weeks    Status New    Target Date 05/09/21      PT LONG TERM GOAL #4   Title Patient will be able to perform independently a sequence of morning stretches/mobilizations in order to decrease pain and improve mobility upon waking for full function and participation in ADLs.    Baseline IE: provided    Time 6    Period Weeks    Status New    Target Date 05/09/21      PT LONG TERM GOAL #5   Title Patient will improve LLE strength grossly to 4/5 MMT or greater in order to demonstrate clinically significant improvement in strength and improved function/QoL.    Baseline IE: 3/5    Time 6    Period Weeks    Status New    Target Date 05/09/21                    Plan - 03/28/21 0955     Clinical Impression Statement Patient is a 50 year old presenting to clinic with chief complaints of chronic L hip pain. Upon examination, patient demonstrates deficits in strength, posture, AROM, and pain as evidenced by 10/10 worst pain, 7/10 pain with ~15 degrees of L hip ER, grossly 3/5 MMT LLE strength, elevated L IC and R thoracic rotation in seated and standing postures. Patient's responses on FOTO outcome measures (52) indicate significant functional limitations/disability/distress. Patient's progress may be limited due to persistence of state and previous non-responsiveness to physical therapy; however, patient's motivation is advantageous. Patient was able to achieve basic morning stretches (access  code: [email or web address]) without irritation during today's evaluation and responded positively to heel lift on R for improved pelvic balance/symmetry. Patient will benefit from continued skilled therapeutic intervention to address deficits in strength, posture, AROM, and pain in order to increase function and improve overall QOL.    Personal Factors and Comorbidities Comorbidity 3+;Age;Past/Current Experience;Behavior Pattern;Time since onset of injury/illness/exacerbation    Comorbidities HTN, DDD lumbar, OA, mixed hyperlipidemia    Examination-Activity Limitations Bed Mobility;Sit;Transfers;Squat;Lift;Locomotion Level;Stairs;Stand;Carry    Examination-Participation Restrictions Occupation;Driving;Meal Prep;Cleaning;Laundry    Stability/Clinical Decision Making Evolving/Moderate complexity    Clinical Decision Making Moderate    Rehab Potential Fair    PT Frequency 2x / week    PT Duration 6 weeks    PT Treatment/Interventions ADLs/Self Care Home Management;Cryotherapy;Electrical Stimulation;Moist Heat;Iontophoresis 4mg /ml Dexamethasone;Therapeutic activities;Therapeutic exercise;Neuromuscular re-education;Patient/family education;Manual techniques;Scar mobilization;Joint Manipulations;Spinal Manipulations;Taping    PT Next Visit Plan manual PRN, gentle hip stretches    PT Home Exercise Plan [id number]    Consulted and Agree with Plan of Care Patient             Patient will benefit from skilled therapeutic intervention in order to improve the following deficits and impairments:  Pain, Postural dysfunction, Improper body mechanics, Abnormal gait, Decreased range of motion, Difficulty walking, Decreased activity tolerance, Decreased balance  Visit Diagnosis: Sacroiliac pain  Pain in left hip  Muscle weakness (generalized)     Problem List Patient Active Problem List   Diagnosis Date Noted   Chronic SI joint pain 11/03/2020   Greater trochanteric bursitis of left hip 11/03/2020    Osteoarthritis of spine with radiculopathy, lumbar region 11/03/2020   Mixed hyperlipidemia 05/28/2019   Displacement of lumbar  intervertebral disc without myelopathy 05/27/2019   Enthesopathy of hip region 05/27/2019   Fibromyositis 05/27/2019   Trigger finger of both hands 05/05/2015   Essential hypertension 01/09/2015   Degenerative disc disease, lumbar 01/09/2015   Allergic rhinitis 01/09/2015   Arthritis of hand 01/09/2015   Tobacco use disorder 01/09/2015   Arthralgia of hip or thigh 08/18/2009   Awareness of heartbeats 08/18/2009    Sheria Lang PT, DPT [id number]  03/28/2021, 4:51 PM  [facility] [address] [place], [place], [address] Phone: [phone]   Fax:  [phone]  Name: April Fernandez MRN: [id number] Date of Birth: 1970-08-07

## 2021-03-28 NOTE — Assessment & Plan Note (Signed)
See additional assessment(s) for plan details. 

## 2021-03-28 NOTE — Patient Instructions (Signed)
-   Stop meloxicam and start diclofenac - Diclofenac to be taken up to twice a day on an as-needed basis with food - Can dose nightly cyclobenzaprine (muscle relaxer) and tightness to physical therapy - Continue previous medications - Start physical therapy as discussed, perform home exercises as overseen by PT - Return for follow-up in 6 weeks, contact us for any questions or concerns between now and then

## 2021-03-28 NOTE — Progress Notes (Signed)
Primary Care / Sports Medicine Office Visit  Patient Information:  Patient ID: April Fernandez, female DOB: 1970/07/20 Age: 50 y.o. MRN: [id number]   April Fernandez is a pleasant 50 y.o. female presenting with the following:  Chief Complaint  Patient presents with   Chronic SI joint pain    Following with PT starting today; taking Ultram, meloxicam, and heat with some relief; 6/10 pain   Greater trochanteric bursitis of left hip    Review of Systems pertinent details above   Patient Active Problem List   Diagnosis Date Noted   Chronic SI joint pain 11/03/2020   Greater trochanteric bursitis of left hip 11/03/2020   Osteoarthritis of spine with radiculopathy, lumbar region 11/03/2020   Mixed hyperlipidemia 05/28/2019   Displacement of lumbar intervertebral disc without myelopathy 05/27/2019   Enthesopathy of hip region 05/27/2019   Fibromyositis 05/27/2019   Trigger finger of both hands 05/05/2015   Essential hypertension 01/09/2015   Degenerative disc disease, lumbar 01/09/2015   Allergic rhinitis 01/09/2015   Arthritis of hand 01/09/2015   Tobacco use disorder 01/09/2015   Arthralgia of hip or thigh 08/18/2009   Awareness of heartbeats 08/18/2009   Past Medical History:  Diagnosis Date   Hypertension    Outpatient Encounter Medications as of 03/28/2021  Medication Sig   cyclobenzaprine (FLEXERIL) 5 MG tablet Take 1 tablet (5 mg total) by mouth at bedtime as needed for muscle spasms.   diclofenac (VOLTAREN) 75 MG EC tablet Take 1 tablet (75 mg total) by mouth 2 (two) times daily as needed (pain).   olmesartan (BENICAR) 40 MG tablet TAKE 1 TABLET(40 MG) BY MOUTH DAILY   traMADol-acetaminophen (ULTRACET) 37.5-325 MG tablet TAKE 1 TO 2 TABLETS BY MOUTH EVERY 6 HOURS AS NEEDED   VIENVA 0.1-20 MG-MCG tablet TAKE 1 TABLET BY MOUTH DAILY   [DISCONTINUED] meloxicam (MOBIC) 15 MG tablet TAKE 1 TABLET(15 MG) BY MOUTH DAILY AS NEEDED FOR PAIN   No facility-administered  encounter medications on file as of 03/28/2021.   Past Surgical History:  Procedure Laterality Date   lumbar ESI  2007    Vitals:   03/28/21 0908  BP: (!) 164/90  Pulse: 70  SpO2: 99%   Vitals:   03/28/21 0908  Weight: 144 lb (65.3 kg)  Height: 5\' 2"  (1.575 m)   Body mass index is 26.34 kg/m.  No results found.   Independent interpretation of notes and tests performed by another provider:   None  Procedures performed:   None  Pertinent History, Exam, Impression, and Recommendations:   Greater trochanteric bursitis of left hip Patient with chronic left lateral hip pain, previous evaluate for greater trochanteric bursitis.  Examination today reveals focal tenderness at the greater trochanteric region on the left, right greater than left SI joints, gluteus medius tendon distribution, deep palpation at the piriformis, and positive Ober's testing decreased IT band flexibility.  No radicular features elicited on examination.  Patient has findings consistent with underlying primary greater trochanteric pain syndrome with secondary/compensatory features throughout her deep hip/core musculature and left leg.  We revisited treatment options from prior visits and she is amenable to formal physical therapy, has first visit scheduled later today.  I have advised a transition from scheduled meloxicam to as needed diclofenac, nightly cyclobenzaprine as needed, continue previously prescribed tramadol.  We will coordinate follow-up in 6 weeks.  If suboptimal progress noted, or pain that prevents participation PT, sooner visit or corticosteroid injections can be considered.  Chronic SI joint  pain See additional assessment(s) for plan details.   Orders & Medications Meds ordered this encounter  Medications   diclofenac (VOLTAREN) 75 MG EC tablet    Sig: Take 1 tablet (75 mg total) by mouth 2 (two) times daily as needed (pain).    Dispense:  84 tablet    Refill:  0   cyclobenzaprine  (FLEXERIL) 5 MG tablet    Sig: Take 1 tablet (5 mg total) by mouth at bedtime as needed for muscle spasms.    Dispense:  30 tablet    Refill:  0   Orders Placed This Encounter  Procedures   Ambulatory referral to Physical Therapy     Return in about 6 weeks (around 05/09/2021).     Jerrol Banana, MD   Primary Care Sports Medicine [facility]

## 2021-04-03 ENCOUNTER — Ambulatory Visit: Payer: BC Managed Care – PPO | Admitting: Physical Therapy

## 2021-04-03 ENCOUNTER — Encounter: Payer: Self-pay | Admitting: Physical Therapy

## 2021-04-03 ENCOUNTER — Other Ambulatory Visit: Payer: Self-pay

## 2021-04-03 DIAGNOSIS — M533 Sacrococcygeal disorders, not elsewhere classified: Secondary | ICD-10-CM

## 2021-04-03 DIAGNOSIS — M25552 Pain in left hip: Secondary | ICD-10-CM

## 2021-04-03 DIAGNOSIS — M6281 Muscle weakness (generalized): Secondary | ICD-10-CM | POA: Diagnosis not present

## 2021-04-03 DIAGNOSIS — M7062 Trochanteric bursitis, left hip: Secondary | ICD-10-CM | POA: Diagnosis not present

## 2021-04-03 DIAGNOSIS — G8929 Other chronic pain: Secondary | ICD-10-CM | POA: Diagnosis not present

## 2021-04-03 NOTE — Therapy (Signed)
[facility] [address] [place], [place], [address] Phone: [phone]   Fax:  [phone]  Physical Therapy Treatment  Patient Details  Name: April Fernandez MRN: [id number] Date of Birth: 1970-10-01 Referring Provider (PT): Joseph Berkshire   Encounter Date: 04/03/2021   PT End of Session - 04/03/21 1204     Visit Number 2    Number of Visits 12    Date for PT Re-Evaluation 05/09/21    Authorization Type IE 03/28/2021    PT Start Time 1200    PT Stop Time 1240    PT Time Calculation (min) 40 min    Activity Tolerance Patient tolerated treatment well    Behavior During Therapy [facility] for tasks assessed/performed             Past Medical History:  Diagnosis Date   Hypertension     Past Surgical History:  Procedure Laterality Date   lumbar ESI  2007    There were no vitals filed for this visit.   Subjective Assessment - 04/03/21 1202     Subjective Patient reports a busy week hosting family. Accidentally disposed of HEP, but remembered hooklying trunk rotations. Patient denies noticing any significant change since evaluation. Patient did have some increased discomfort after evaluation.    Limitations Sitting;Lifting;Standing;Walking    How long can you sit comfortably? unable to sit comfortably    How long can you stand comfortably? dependent on activity, but up to an hour    How long can you walk comfortably? 15-20 minutes    Currently in Pain? Yes    Pain Location Hip    Pain Orientation Left    Pain Onset More than a month ago            TREATMENT  Manual Therapy: STM and TPR performed to L glute, hamstring, and quadriceps to allow for decreased tension and pain and improved posture and function Sidelying LAD of L hip with oscillations for improved pain and decreased spasm   Neuromuscular Re-education: Patient education on work posture and decreasing knee flexion to limit increased posterior chain  shortening.  Seated lateral pelvic tilts for improved sacral ligament mobility and postural awareness Seated hamstring stretch for improved posture and decreased pain  Patient educated throughout session on appropriate technique and form using multi-modal cueing, HEP, and activity modification. Patient articulated understanding and returned demonstration.  Patient Response to interventions: "I can really feel my whole leg"  ASSESSMENT Patient presents to clinic with excellent motivation to participate in therapy. Patient demonstrates deficits in strength, posture, AROM, and pain. Patient able to achieve decreased tenderness at some tender points in hamstrings with manual intervention during today's session and responded positively to seated postural and mobilization interventions. Patient will benefit from continued skilled therapeutic intervention to address remaining deficits in strength, posture, AROM, and pain in order to increase function and improve overall QOL.     PT Long Term Goals - 03/28/21 1648       PT LONG TERM GOAL #1   Title Patient will be independent with HEP in order to decrease pain and increase strength in order to improve function at home and work.    Baseline IE: [id number]    Time 6    Period Weeks    Status New    Target Date 05/09/21      PT LONG TERM GOAL #2   Title Patient will demonstrate improved function as evidenced by a score of 66  on FOTO measure for full participation in activities at home and in the community.    Baseline IE: 52    Time 6    Period Weeks    Status New    Target Date 05/09/21      PT LONG TERM GOAL #3   Title Patient will be able to walk > 30 minutes with pain controlled to a 3/10 or less on NPRS in order to participate in basic wellness and community activities.    Baseline IE: 15-20 min, 6+/10    Time 6    Period Weeks    Status New    Target Date 05/09/21      PT LONG TERM GOAL #4   Title Patient will be able to perform  independently a sequence of morning stretches/mobilizations in order to decrease pain and improve mobility upon waking for full function and participation in ADLs.    Baseline IE: provided    Time 6    Period Weeks    Status New    Target Date 05/09/21      PT LONG TERM GOAL #5   Title Patient will improve LLE strength grossly to 4/5 MMT or greater in order to demonstrate clinically significant improvement in strength and improved function/QoL.    Baseline IE: 3/5    Time 6    Period Weeks    Status New    Target Date 05/09/21                   Plan - 04/03/21 1204     Clinical Impression Statement Patient presents to clinic with excellent motivation to participate in therapy. Patient demonstrates deficits in strength, posture, AROM, and pain. Patient able to achieve decreased tenderness at some tender points in hamstrings with manual intervention during today's session and responded positively to seated postural and mobilization interventions. Patient will benefit from continued skilled therapeutic intervention to address remaining deficits in strength, posture, AROM, and pain in order to increase function and improve overall QOL.    Personal Factors and Comorbidities Comorbidity 3+;Age;Past/Current Experience;Behavior Pattern;Time since onset of injury/illness/exacerbation    Comorbidities HTN, DDD lumbar, OA, mixed hyperlipidemia    Examination-Activity Limitations Bed Mobility;Sit;Transfers;Squat;Lift;Locomotion Level;Stairs;Stand;Carry    Examination-Participation Restrictions Occupation;Driving;Meal Prep;Cleaning;Laundry    Stability/Clinical Decision Making Evolving/Moderate complexity    Rehab Potential Fair    PT Frequency 2x / week    PT Duration 6 weeks    PT Treatment/Interventions ADLs/Self Care Home Management;Cryotherapy;Electrical Stimulation;Moist Heat;Iontophoresis 4mg /ml Dexamethasone;Therapeutic activities;Therapeutic exercise;Neuromuscular  re-education;Patient/family education;Manual techniques;Scar mobilization;Joint Manipulations;Spinal Manipulations;Taping    PT Next Visit Plan manual PRN, gentle hip stretches    PT Home Exercise Plan [id number]    Consulted and Agree with Plan of Care Patient             Patient will benefit from skilled therapeutic intervention in order to improve the following deficits and impairments:  Pain, Postural dysfunction, Improper body mechanics, Abnormal gait, Decreased range of motion, Difficulty walking, Decreased activity tolerance, Decreased balance  Visit Diagnosis: Sacroiliac pain  Pain in left hip  Muscle weakness (generalized)     Problem List Patient Active Problem List   Diagnosis Date Noted   Chronic SI joint pain 11/03/2020   Greater trochanteric bursitis of left hip 11/03/2020   Osteoarthritis of spine with radiculopathy, lumbar region 11/03/2020   Mixed hyperlipidemia 05/28/2019   Displacement of lumbar intervertebral disc without myelopathy 05/27/2019   Enthesopathy of hip region 05/27/2019   Fibromyositis 05/27/2019  Trigger finger of both hands 05/05/2015   Essential hypertension 01/09/2015   Degenerative disc disease, lumbar 01/09/2015   Allergic rhinitis 01/09/2015   Arthritis of hand 01/09/2015   Tobacco use disorder 01/09/2015   Arthralgia of hip or thigh 08/18/2009   Awareness of heartbeats 08/18/2009    Sheria Lang PT, DPT [phone]  04/03/2021, 1:06 PM  [facility] [address] [place], [place], [address] Phone: [phone]   Fax:  [phone]  Name: April Fernandez MRN: [id number] Date of Birth: 04/29/1971

## 2021-04-05 ENCOUNTER — Other Ambulatory Visit: Payer: Self-pay | Admitting: Internal Medicine

## 2021-04-05 DIAGNOSIS — M5136 Other intervertebral disc degeneration, lumbar region: Secondary | ICD-10-CM

## 2021-04-05 NOTE — Telephone Encounter (Signed)
Requested medications are due for refill today yes  Requested medications are on the active medication list yes  Last refill 03/04/21  Last visit 03/28/21  Future visit scheduled 10/09/20 (asked to return in 6 weeks mid Dec, no appt yet)  Notes to clinic This medication can not be delegated, please assess.   Requested Prescriptions  Pending Prescriptions Disp Refills   traMADol-acetaminophen (ULTRACET) 37.5-325 MG tablet [Pharmacy Med Name: TRAMADOL/APAP 37.5MG /325MG  TABS] 180 tablet     Sig: TAKE 1 TO 2 TABLETS BY MOUTH EVERY 6 HOURS AS NEEDED     Not Delegated - Analgesics:  Opioid Agonist Combinations Failed - 04/05/2021  2:20 PM      Failed - This refill cannot be delegated      Failed - Urine Drug Screen completed in last 360 days      Passed - Valid encounter within last 6 months    Recent Outpatient Visits           1 week ago Greater trochanteric bursitis of left hip   [facility] Jerrol Banana, MD   4 months ago Chronic SI joint pain   [facility] Jerrol Banana, MD   4 months ago Greater trochanteric bursitis of left hip   [facility] Jerrol Banana, MD   5 months ago Greater trochanteric bursitis of left hip   [facility] Jerrol Banana, MD   6 months ago Annual physical exam   [facility] Reubin Milan, MD       Future Appointments             In 6 months Judithann Graves Nyoka Cowden, MD [facility]

## 2021-04-09 DIAGNOSIS — Z23 Encounter for immunization: Secondary | ICD-10-CM | POA: Diagnosis not present

## 2021-04-13 ENCOUNTER — Telehealth: Payer: Self-pay

## 2021-04-13 NOTE — Telephone Encounter (Signed)
Copied from CRM [id number]. Topic: General - Other >> Apr 13, 2021  1:16 PM Gwenlyn Fudge wrote: Reason for CRM: Pt called stating that she has set up mychart and is requesting to have the [facility] paperwork uploaded. Please advise.

## 2021-04-13 NOTE — Telephone Encounter (Signed)
Pt called stating that the this is supposed to go to Dr. Ashley Royalty. She states that [place] has been the one working on this for her. Please advise.

## 2021-04-13 NOTE — Telephone Encounter (Signed)
Spoke with patient and advised I could mail her the paperwork, but she would not be able to see it in her chart.  Patient was curious to what limitations/restrictions for work she would have and I advised Dr. Ashley Royalty said there were none.  Patient has decided to have her Physical Therapist fill out the paperwork to document a better idea of limitations based on range of motion, lifting, bending, sitting, etc. For your information.

## 2021-04-13 NOTE — Telephone Encounter (Signed)
Left voice mail with patient to call about with details on FMLA paper work

## 2021-04-24 ENCOUNTER — Ambulatory Visit: Payer: BC Managed Care – PPO | Admitting: Physical Therapy

## 2021-04-24 ENCOUNTER — Encounter: Payer: Self-pay | Admitting: Physical Therapy

## 2021-04-24 ENCOUNTER — Other Ambulatory Visit: Payer: Self-pay

## 2021-04-24 ENCOUNTER — Encounter: Payer: Self-pay | Admitting: Internal Medicine

## 2021-04-24 ENCOUNTER — Ambulatory Visit: Payer: BC Managed Care – PPO | Admitting: Internal Medicine

## 2021-04-24 VITALS — BP 148/88 | HR 78 | Temp 98.6°F | Ht 62.0 in | Wt 146.0 lb

## 2021-04-24 DIAGNOSIS — M25552 Pain in left hip: Secondary | ICD-10-CM

## 2021-04-24 DIAGNOSIS — M5136 Other intervertebral disc degeneration, lumbar region: Secondary | ICD-10-CM | POA: Diagnosis not present

## 2021-04-24 DIAGNOSIS — M6281 Muscle weakness (generalized): Secondary | ICD-10-CM

## 2021-04-24 DIAGNOSIS — I10 Essential (primary) hypertension: Secondary | ICD-10-CM | POA: Diagnosis not present

## 2021-04-24 DIAGNOSIS — G8929 Other chronic pain: Secondary | ICD-10-CM | POA: Diagnosis not present

## 2021-04-24 DIAGNOSIS — M7062 Trochanteric bursitis, left hip: Secondary | ICD-10-CM | POA: Diagnosis not present

## 2021-04-24 DIAGNOSIS — M533 Sacrococcygeal disorders, not elsewhere classified: Secondary | ICD-10-CM | POA: Diagnosis not present

## 2021-04-24 MED ORDER — OLMESARTAN MEDOXOMIL-HCTZ 40-25 MG PO TABS: 1.0000 | ORAL_TABLET | Freq: Every day | ORAL | 0 refills | Status: DC

## 2021-04-24 NOTE — Progress Notes (Signed)
Date:  04/24/2021   Name:  April Fernandez   DOB:  [birth year]   MRN:  [id number]   Chief Complaint: Hypertension and Back Pain (X 20+  left side, runs all the way down the leg )  Hypertension This is a chronic problem. The current episode started more than 1 year ago. The problem is unchanged. The problem is controlled. Pertinent negatives include no chest pain, headaches, palpitations or shortness of breath. There are no associated agents to hypertension. Risk factors for coronary artery disease include smoking/tobacco exposure. Past treatments include angiotensin blockers. The current treatment provides moderate improvement. There are no compliance problems.   Back Pain This is a chronic problem. The current episode started more than 1 year ago. The problem occurs constantly. The problem has been gradually worsening since onset. The quality of the pain is described as aching. The pain radiates to the left thigh. The pain is at a severity of 7/10. The pain is moderate. The pain is Worse during the day. The symptoms are aggravated by twisting, sitting, bending and lying down. Pertinent negatives include no abdominal pain, chest pain, headaches or weakness. She has tried muscle relaxant for the symptoms. The treatment provided mild relief.   Lab Results  Component Value Date   NA 137 10/05/2020   K 4.7 10/05/2020   CO2 25 10/05/2020   GLUCOSE 88 10/05/2020   BUN 7 10/05/2020   CREATININE 1.05 (H) 10/05/2020   CALCIUM 9.7 10/05/2020   EGFR 64 10/05/2020   GFRNONAA 73 05/27/2019   Lab Results  Component Value Date   CHOL 241 (H) 10/05/2020   HDL 52 10/05/2020   LDLCALC 176 (H) 10/05/2020   TRIG 75 10/05/2020   CHOLHDL 4.6 (H) 10/05/2020   Lab Results  Component Value Date   TSH 1.210 10/05/2020   No results found for: HGBA1C Lab Results  Component Value Date   WBC 4.9 10/05/2020   HGB 12.7 10/05/2020   HCT 40.5 10/05/2020   MCV 91 10/05/2020   PLT 291 10/05/2020   Lab  Results  Component Value Date   ALT 9 10/05/2020   AST 11 10/05/2020   ALKPHOS 50 10/05/2020   BILITOT 0.5 10/05/2020   No results found for: 25OHVITD2, 25OHVITD3, VD25OH   Review of Systems  Constitutional:  Negative for fatigue and unexpected weight change.  HENT:  Negative for nosebleeds.   Eyes:  Negative for visual disturbance.  Respiratory:  Negative for cough, chest tightness, shortness of breath and wheezing.   Cardiovascular:  Negative for chest pain, palpitations and leg swelling.  Gastrointestinal:  Negative for abdominal pain, constipation and diarrhea.  Musculoskeletal:  Positive for back pain.  Neurological:  Negative for dizziness, weakness, light-headedness and headaches.   Patient Active Problem List   Diagnosis Date Noted   Chronic SI joint pain 11/03/2020   Greater trochanteric bursitis of left hip 11/03/2020   Osteoarthritis of spine with radiculopathy, lumbar region 11/03/2020   Mixed hyperlipidemia 05/28/2019   Displacement of lumbar intervertebral disc without myelopathy 05/27/2019   Enthesopathy of hip region 05/27/2019   Fibromyositis 05/27/2019   Trigger finger of both hands 05/05/2015   Essential hypertension 01/09/2015   Degenerative disc disease, lumbar 01/09/2015   Allergic rhinitis 01/09/2015   Arthritis of hand 01/09/2015   Tobacco use disorder 01/09/2015   Arthralgia of hip or thigh 08/18/2009   Awareness of heartbeats 08/18/2009    Allergies  Allergen Reactions   Other Hives    Tree Nuts  Shellfish Allergy Hives    Past Surgical History:  Procedure Laterality Date   lumbar ESI  2007    Social History   Tobacco Use   Smoking status: Every Day    Packs/day: 0.50    Years: 34.00    Pack years: 17.00    Types: Cigarettes   Smokeless tobacco: Never  Vaping Use   Vaping Use: Former   Quit date: 05/27/2016  Substance Use Topics   Alcohol use: Not Currently   Drug use: Never     Medication list has been reviewed and  updated.  Current Meds  Medication Sig   cyclobenzaprine (FLEXERIL) 5 MG tablet Take 1 tablet (5 mg total) by mouth at bedtime as needed for muscle spasms.   diclofenac (VOLTAREN) 0.1 % ophthalmic solution 4 (four) times daily.   diclofenac (VOLTAREN) 75 MG EC tablet Take 1 tablet (75 mg total) by mouth 2 (two) times daily as needed (pain).   meloxicam (MOBIC) 15 MG tablet Take 15 mg by mouth daily.   olmesartan-hydrochlorothiazide (BENICAR HCT) 40-25 MG tablet Take 1 tablet by mouth daily.   traMADol-acetaminophen (ULTRACET) 37.5-325 MG tablet TAKE 1 TO 2 TABLETS BY MOUTH EVERY 6 HOURS AS NEEDED   VIENVA 0.1-20 MG-MCG tablet TAKE 1 TABLET BY MOUTH DAILY   [DISCONTINUED] olmesartan (BENICAR) 40 MG tablet TAKE 1 TABLET(40 MG) BY MOUTH DAILY    PHQ 2/9 Scores 04/24/2021 03/28/2021 11/29/2020 11/16/2020  PHQ - 2 Score 2 0 0 0  PHQ- 9 Score '4 1 1 1    ' GAD 7 : Generalized Anxiety Score 04/24/2021 03/28/2021 11/29/2020 11/16/2020  Nervous, Anxious, on Edge 0 0 0 0  Control/stop worrying 0 0 0 0  Worry too much - different things 0 0 0 0  Trouble relaxing 0 0 0 0  Restless 0 0 0 0  Easily annoyed or irritable 0 0 0 0  Afraid - awful might happen 0 0 0 0  Total GAD 7 Score 0 0 0 0  Anxiety Difficulty - Not difficult at all Not difficult at all Not difficult at all    BP Readings from Last 3 Encounters:  04/24/21 (!) 148/88  03/28/21 (!) 164/90  11/29/20 134/88    Physical Exam Vitals and nursing note reviewed.  Constitutional:      General: She is not in acute distress.    Appearance: Normal appearance. She is well-developed.  HENT:     Head: Normocephalic and atraumatic.  Cardiovascular:     Rate and Rhythm: Normal rate and regular rhythm.     Pulses: Normal pulses.     Heart sounds: No murmur heard. Pulmonary:     Effort: Pulmonary effort is normal. No respiratory distress.  Musculoskeletal:     Cervical back: Normal range of motion.     Right lower leg: No edema.     Left  lower leg: No edema.  Lymphadenopathy:     Cervical: No cervical adenopathy.  Skin:    General: Skin is warm and dry.     Capillary Refill: Capillary refill takes less than 2 seconds.     Findings: No rash.  Neurological:     Mental Status: She is alert and oriented to person, place, and time.  Psychiatric:        Mood and Affect: Mood normal.        Behavior: Behavior normal.    Wt Readings from Last 3 Encounters:  04/24/21 146 lb (66.2 kg)  03/28/21 144 lb (65.3  kg)  11/29/20 145 lb (65.8 kg)    BP (!) 148/88   Pulse 78   Temp 98.6 F (37 C) (Oral)   Ht '5\' 2"'  (1.575 m)   Wt 146 lb (66.2 kg)   SpO2 98%   BMI 26.70 kg/m   Assessment and Plan: 1. Essential hypertension BP not well controlled on olmesartan 40 mg.  Will change medication and follow up in 10 weeks - olmesartan-hydrochlorothiazide (BENICAR HCT) 40-25 MG tablet; Take 1 tablet by mouth daily.  Dispense: 90 tablet; Refill: 0  2. Degenerative disc disease, lumbar On-going low back pain treated with Tramadol/APAP - 5-6 tabs daily with moderate relief. Currently in PT for her hip and back.  May need to re-visit Ortho for possible MRI if sx do not improve. She would like to get FMLA to allow her to keep her job and still be out of work when her back flares.  Papers will be completed today and mailed back to patient.  Partially dictated using Editor, commissioning. Any errors are unintentional.  Halina Maidens, MD [facility]  04/24/2021

## 2021-04-24 NOTE — Therapy (Signed)
[facility] [address] [place], [place], [address] Phone: [phone]   Fax:  [phone]  Physical Therapy Treatment  Patient Details  Name: April Fernandez MRN: [id number] Date of Birth: Aug 09, 1970 Referring Provider (PT): Joseph Berkshire   Encounter Date: 04/24/2021   PT End of Session - 04/24/21 1334     Visit Number 3    Number of Visits 12    Date for PT Re-Evaluation 05/09/21    Authorization Type IE 03/28/2021    PT Start Time 1330    PT Stop Time 1410    PT Time Calculation (min) 40 min    Activity Tolerance Patient tolerated treatment well    Behavior During Therapy [facility] for tasks assessed/performed             Past Medical History:  Diagnosis Date   Hypertension     Past Surgical History:  Procedure Laterality Date   lumbar ESI  2007    There were no vitals filed for this visit.   Subjective Assessment - 04/24/21 1332     Subjective Patient reports that after last session she was in significant pain. Patient does note that she has done HEP, but they are nto offering relief. Patient notes that she does not do these everyday or consistently. Patient notes that today and a couple days last week she has been very achey. Patient does endorse that heel lift make s a big difference with walking and standing.    Limitations Sitting;Lifting;Standing;Walking    How long can you sit comfortably? unable to sit comfortably    How long can you stand comfortably? dependent on activity, but up to an hour    How long can you walk comfortably? 15-20 minutes    Currently in Pain? Yes    Pain Score 7     Pain Location Hip    Pain Orientation Left    Pain Descriptors / Indicators Aching    Pain Onset More than a month ago             TREATMENT  Neuromuscular Re-education: Supine a/p pelvic tilts with limited ROM for pain modulation and improved posture Supine hooklying trunk rotations with with limited ROM for  pain modulation and improved posture Supine single knee to chest for pain modulation and improved posture Supine figure 4 for pain modulation and improved posture Supine hooklying, TrA activation with exhalation. VCs and TCs to decrease compensatory patterns and minimize aggravation of the lumbar paraspinals.   Patient educated throughout session on appropriate technique and form using multi-modal cueing, HEP, and activity modification. Patient articulated understanding and returned demonstration.  Patient Response to interventions: Noted significant release in lumbar spine with TrA; 5/10 pain in sitting  ASSESSMENT Patient presents to clinic with excellent motivation to participate in therapy. Patient demonstrates deficits in strength, posture, AROM, and pain. Patient with improved response to postural interventions with significant reduction in pain during today's session and feels comfortable to work in new abdominal strengthening intervention. Patient will benefit from continued skilled therapeutic intervention to address remaining deficits in strength, posture, AROM, and pain in order to increase function and improve overall QOL.    PT Long Term Goals - 03/28/21 1648       PT LONG TERM GOAL #1   Title Patient will be independent with HEP in order to decrease pain and increase strength in order to improve function at home and work.    Baseline IE: [id number]  Time 6    Period Weeks    Status New    Target Date 05/09/21      PT LONG TERM GOAL #2   Title Patient will demonstrate improved function as evidenced by a score of 66 on FOTO measure for full participation in activities at home and in the community.    Baseline IE: 52    Time 6    Period Weeks    Status New    Target Date 05/09/21      PT LONG TERM GOAL #3   Title Patient will be able to walk > 30 minutes with pain controlled to a 3/10 or less on NPRS in order to participate in basic wellness and community activities.     Baseline IE: 15-20 min, 6+/10    Time 6    Period Weeks    Status New    Target Date 05/09/21      PT LONG TERM GOAL #4   Title Patient will be able to perform independently a sequence of morning stretches/mobilizations in order to decrease pain and improve mobility upon waking for full function and participation in ADLs.    Baseline IE: provided    Time 6    Period Weeks    Status New    Target Date 05/09/21      PT LONG TERM GOAL #5   Title Patient will improve LLE strength grossly to 4/5 MMT or greater in order to demonstrate clinically significant improvement in strength and improved function/QoL.    Baseline IE: 3/5    Time 6    Period Weeks    Status New    Target Date 05/09/21                   Plan - 04/24/21 1337     Clinical Impression Statement Patient presents to clinic with excellent motivation to participate in therapy. Patient demonstrates deficits in strength, posture, AROM, and pain. Patient with improved response to postural interventions with significant reduction in pain during today's session and feels comfortable to work in new abdominal strengthening intervention. Patient will benefit from continued skilled therapeutic intervention to address remaining deficits in strength, posture, AROM, and pain in order to increase function and improve overall QOL.    Personal Factors and Comorbidities Comorbidity 3+;Age;Past/Current Experience;Behavior Pattern;Time since onset of injury/illness/exacerbation    Comorbidities HTN, DDD lumbar, OA, mixed hyperlipidemia    Examination-Activity Limitations Bed Mobility;Sit;Transfers;Squat;Lift;Locomotion Level;Stairs;Stand;Carry    Examination-Participation Restrictions Occupation;Driving;Meal Prep;Cleaning;Laundry    Stability/Clinical Decision Making Evolving/Moderate complexity    Rehab Potential Fair    PT Frequency 2x / week    PT Duration 6 weeks    PT Treatment/Interventions ADLs/Self Care Home  Management;Cryotherapy;Electrical Stimulation;Moist Heat;Iontophoresis 4mg /ml Dexamethasone;Therapeutic activities;Therapeutic exercise;Neuromuscular re-education;Patient/family education;Manual techniques;Scar mobilization;Joint Manipulations;Spinal Manipulations;Taping    PT Next Visit Plan manual PRN, gentle hip stretches    PT Home Exercise Plan [id number]    Consulted and Agree with Plan of Care Patient             Patient will benefit from skilled therapeutic intervention in order to improve the following deficits and impairments:  Pain, Postural dysfunction, Improper body mechanics, Abnormal gait, Decreased range of motion, Difficulty walking, Decreased activity tolerance, Decreased balance  Visit Diagnosis: Sacroiliac pain  Pain in left hip  Muscle weakness (generalized)     Problem List Patient Active Problem List   Diagnosis Date Noted   Chronic SI joint pain 11/03/2020   Greater trochanteric bursitis  of left hip 11/03/2020   Osteoarthritis of spine with radiculopathy, lumbar region 11/03/2020   Mixed hyperlipidemia 05/28/2019   Displacement of lumbar intervertebral disc without myelopathy 05/27/2019   Enthesopathy of hip region 05/27/2019   Fibromyositis 05/27/2019   Trigger finger of both hands 05/05/2015   Essential hypertension 01/09/2015   Degenerative disc disease, lumbar 01/09/2015   Allergic rhinitis 01/09/2015   Arthritis of hand 01/09/2015   Tobacco use disorder 01/09/2015   Arthralgia of hip or thigh 08/18/2009   Awareness of heartbeats 08/18/2009    Sheria Lang PT, DPT [phone]  04/24/2021, 2:38 PM  [place] [facility] [address] [place], [place], [address] Phone: [phone]   Fax:  [phone]  Name: April Fernandez MRN: [id number] Date of Birth: 1970-12-10

## 2021-04-25 ENCOUNTER — Ambulatory Visit: Payer: BC Managed Care – PPO | Admitting: Internal Medicine

## 2021-05-09 ENCOUNTER — Ambulatory Visit: Payer: BC Managed Care – PPO | Admitting: Physical Therapy

## 2021-05-15 ENCOUNTER — Other Ambulatory Visit: Payer: Self-pay | Admitting: Internal Medicine

## 2021-05-15 DIAGNOSIS — M5136 Other intervertebral disc degeneration, lumbar region: Secondary | ICD-10-CM

## 2021-05-15 NOTE — Telephone Encounter (Signed)
Please review. Last office visit 04/24/2021.  KP

## 2021-05-15 NOTE — Telephone Encounter (Signed)
Requested medication (s) are due for refill today: yes  Requested medication (s) are on the active medication list: yes  Last refill:  04/09/21  Future visit scheduled: 07/03/21  Notes to clinic:  This medication can not be delegated, please assess.   Requested Prescriptions  Pending Prescriptions Disp Refills   traMADol-acetaminophen (ULTRACET) 37.5-325 MG tablet [Pharmacy Med Name: TRAMADOL/APAP 37.5MG /325MG  TABS] 180 tablet     Sig: TAKE 1 TO 2 TABLETS BY MOUTH EVERY 6 HOURS AS NEEDED     Not Delegated - Analgesics:  Opioid Agonist Combinations Failed - 05/15/2021 12:04 AM      Failed - This refill cannot be delegated      Failed - Urine Drug Screen completed in last 360 days      Passed - Valid encounter within last 6 months    Recent Outpatient Visits           3 weeks ago Essential hypertension   [facility] Reubin Milan, MD   1 month ago Greater trochanteric bursitis of left hip   [facility] Jerrol Banana, MD   5 months ago Chronic SI joint pain   [facility] Jerrol Banana, MD   6 months ago Greater trochanteric bursitis of left hip   [facility] Jerrol Banana, MD   6 months ago Greater trochanteric bursitis of left hip   [facility] Jerrol Banana, MD       Future Appointments             In 1 month Judithann Graves Nyoka Cowden, MD [facility]   In 4 months Judithann Graves, Nyoka Cowden, MD [facility], [facility]

## 2021-05-21 ENCOUNTER — Other Ambulatory Visit: Payer: Self-pay | Admitting: Internal Medicine

## 2021-05-21 DIAGNOSIS — I10 Essential (primary) hypertension: Secondary | ICD-10-CM

## 2021-06-05 ENCOUNTER — Encounter: Payer: Self-pay | Admitting: Internal Medicine

## 2021-06-05 ENCOUNTER — Other Ambulatory Visit: Payer: Self-pay | Admitting: Internal Medicine

## 2021-06-05 DIAGNOSIS — I10 Essential (primary) hypertension: Secondary | ICD-10-CM

## 2021-06-05 MED ORDER — OLMESARTAN MEDOXOMIL 40 MG PO TABS: ORAL_TABLET | ORAL | 0 refills | Status: DC

## 2021-06-05 MED ORDER — AMLODIPINE BESYLATE 5 MG PO TABS: 5.0000 mg | ORAL_TABLET | Freq: Every day | ORAL | 0 refills | Status: DC

## 2021-06-28 ENCOUNTER — Other Ambulatory Visit: Payer: Self-pay | Admitting: Internal Medicine

## 2021-06-28 NOTE — Telephone Encounter (Signed)
Requested medications are due for refill today.  Unsure  Requested medications are on the active medications list.  yes  Last refill. 04/24/2021  Future visit scheduled.   yes  Notes to clinic.  Last written as historical med/ provider.    Requested Prescriptions  Pending Prescriptions Disp Refills   meloxicam (MOBIC) 15 MG tablet [Pharmacy Med Name: MELOXICAM 15MG TABLETS] 90 tablet     Sig: TAKE 1 TABLET(15 MG) BY MOUTH DAILY AS NEEDED FOR PAIN     Analgesics:  COX2 Inhibitors Failed - 06/28/2021  6:06 PM      Failed - Manual Review: Labs are only required if the patient has taken medication for more than 8 weeks.      Failed - Cr in normal range and within 360 days    Creatinine, Ser  Date Value Ref Range Status  10/05/2020 1.05 (H) 0.57 - 1.00 mg/dL Final          Passed - HGB in normal range and within 360 days    Hemoglobin  Date Value Ref Range Status  10/05/2020 12.7 11.1 - 15.9 g/dL Final          Passed - HCT in normal range and within 360 days    Hematocrit  Date Value Ref Range Status  10/05/2020 40.5 34.0 - 46.6 % Final          Passed - AST in normal range and within 360 days    AST  Date Value Ref Range Status  10/05/2020 11 0 - 40 IU/L Final          Passed - ALT in normal range and within 360 days    ALT  Date Value Ref Range Status  10/05/2020 9 0 - 32 IU/L Final          Passed - eGFR is 30 or above and within 360 days    GFR calc Af Amer  Date Value Ref Range Status  05/27/2019 84 >59 mL/min/1.73 Final   GFR calc non Af Amer  Date Value Ref Range Status  05/27/2019 73 >59 mL/min/1.73 Final   eGFR  Date Value Ref Range Status  10/05/2020 64 >59 mL/min/1.73 Final          Passed - Patient is not pregnant      Passed - Valid encounter within last 12 months    Recent Outpatient Visits           2 months ago Essential hypertension   [facility] Glean Hess, MD   3 months ago Greater trochanteric bursitis of  left hip   [facility] Montel Culver, MD   7 months ago Chronic SI joint pain   [facility] Montel Culver, MD   7 months ago Greater trochanteric bursitis of left hip   [facility] Montel Culver, MD   7 months ago Greater trochanteric bursitis of left hip   [facility] Montel Culver, MD       Future Appointments             In 5 days Glean Hess, MD [facility], [place]   In 3 months Army Melia, Jesse Sans, MD [facility], [facility]

## 2021-06-29 ENCOUNTER — Other Ambulatory Visit: Payer: Self-pay | Admitting: Internal Medicine

## 2021-06-29 NOTE — Telephone Encounter (Signed)
Refill if appropriate.  Please advise. Follow-up 07/03/21.

## 2021-07-03 ENCOUNTER — Ambulatory Visit: Payer: BC Managed Care – PPO | Admitting: Internal Medicine

## 2021-07-04 ENCOUNTER — Ambulatory Visit: Payer: BC Managed Care – PPO | Admitting: Internal Medicine

## 2021-07-04 ENCOUNTER — Encounter: Payer: Self-pay | Admitting: Internal Medicine

## 2021-07-04 ENCOUNTER — Other Ambulatory Visit: Payer: Self-pay

## 2021-07-04 VITALS — BP 132/78 | HR 78 | Ht 62.0 in | Wt 144.4 lb

## 2021-07-04 DIAGNOSIS — I10 Essential (primary) hypertension: Secondary | ICD-10-CM

## 2021-07-04 DIAGNOSIS — R87612 Low grade squamous intraepithelial lesion on cytologic smear of cervix (LGSIL): Secondary | ICD-10-CM

## 2021-07-04 DIAGNOSIS — M4726 Other spondylosis with radiculopathy, lumbar region: Secondary | ICD-10-CM | POA: Diagnosis not present

## 2021-07-04 NOTE — Progress Notes (Signed)
Date:  07/04/2021   Name:  April Fernandez   DOB:  13-Nov-1970   MRN:  [id number]   Chief Complaint: Hypertension  Hypertension This is a chronic problem. The problem is controlled. Pertinent negatives include no chest pain, headaches, palpitations or shortness of breath. Past treatments include calcium channel blockers and angiotensin blockers. There are no compliance problems.   Back Pain This is a recurrent problem. The problem occurs every several days. The problem has been waxing and waning since onset. The pain is present in the lumbar spine. The pain radiates to the left thigh. The pain is moderate. The pain is Worse during the day. Pertinent negatives include no abdominal pain, chest pain, headaches or weakness. She has tried analgesics, heat and muscle relaxant for the symptoms. The treatment provided moderate relief.   Lab Results  Component Value Date   NA 137 10/05/2020   K 4.7 10/05/2020   CO2 25 10/05/2020   GLUCOSE 88 10/05/2020   BUN 7 10/05/2020   CREATININE 1.05 (H) 10/05/2020   CALCIUM 9.7 10/05/2020   EGFR 64 10/05/2020   GFRNONAA 73 05/27/2019   Lab Results  Component Value Date   CHOL 241 (H) 10/05/2020   HDL 52 10/05/2020   LDLCALC 176 (H) 10/05/2020   TRIG 75 10/05/2020   CHOLHDL 4.6 (H) 10/05/2020   Lab Results  Component Value Date   TSH 1.210 10/05/2020   No results found for: HGBA1C Lab Results  Component Value Date   WBC 4.9 10/05/2020   HGB 12.7 10/05/2020   HCT 40.5 10/05/2020   MCV 91 10/05/2020   PLT 291 10/05/2020   Lab Results  Component Value Date   ALT 9 10/05/2020   AST 11 10/05/2020   ALKPHOS 50 10/05/2020   BILITOT 0.5 10/05/2020   No results found for: 25OHVITD2, 25OHVITD3, VD25OH   Review of Systems  Constitutional:  Negative for fatigue and unexpected weight change.  HENT:  Negative for nosebleeds.   Eyes:  Negative for visual disturbance.  Respiratory:  Negative for cough, chest tightness, shortness of breath and  wheezing.   Cardiovascular:  Negative for chest pain, palpitations and leg swelling.  Gastrointestinal:  Negative for abdominal pain, constipation and diarrhea.  Genitourinary:  Negative for menstrual problem, vaginal bleeding and vaginal discharge.  Musculoskeletal:  Positive for back pain.  Neurological:  Negative for dizziness, weakness, light-headedness and headaches.   Patient Active Problem List   Diagnosis Date Noted   Chronic SI joint pain 11/03/2020   Greater trochanteric bursitis of left hip 11/03/2020   Osteoarthritis of spine with radiculopathy, lumbar region 11/03/2020   Mixed hyperlipidemia 05/28/2019   Displacement of lumbar intervertebral disc without myelopathy 05/27/2019   Enthesopathy of hip region 05/27/2019   Fibromyositis 05/27/2019   Trigger finger of both hands 05/05/2015   Essential hypertension 01/09/2015   Degenerative disc disease, lumbar 01/09/2015   Allergic rhinitis 01/09/2015   Arthritis of hand 01/09/2015   Tobacco use disorder 01/09/2015   Arthralgia of hip or thigh 08/18/2009   Awareness of heartbeats 08/18/2009    Allergies  Allergen Reactions   Other Hives    Tree Nuts   Shellfish Allergy Hives    Past Surgical History:  Procedure Laterality Date   lumbar ESI  2007    Social History   Tobacco Use   Smoking status: Every Day    Packs/day: 0.50    Years: 34.00    Pack years: 17.00    Types: Cigarettes  Smokeless tobacco: Never  Vaping Use   Vaping Use: Former   Quit date: 05/27/2016  Substance Use Topics   Alcohol use: Not Currently   Drug use: Never     Medication list has been reviewed and updated.  Current Meds  Medication Sig   amLODipine (NORVASC) 5 MG tablet Take 1 tablet (5 mg total) by mouth daily.   cyclobenzaprine (FLEXERIL) 5 MG tablet Take 1 tablet (5 mg total) by mouth at bedtime as needed for muscle spasms.   diclofenac (VOLTAREN) 0.1 % ophthalmic solution 4 (four) times daily.   diclofenac (VOLTAREN) 75 MG  EC tablet Take 75 mg by mouth 2 (two) times daily.   olmesartan (BENICAR) 40 MG tablet TAKE 1 TABLET(40 MG) BY MOUTH DAILY   traMADol-acetaminophen (ULTRACET) 37.5-325 MG tablet TAKE 1 TO 2 TABLETS BY MOUTH EVERY 6 HOURS AS NEEDED   VIENVA 0.1-20 MG-MCG tablet TAKE 1 TABLET BY MOUTH DAILY    PHQ 2/9 Scores 07/04/2021 04/24/2021 03/28/2021 11/29/2020  PHQ - 2 Score 0 2 0 0  PHQ- 9 Score _0 GAD 7 : Generalized Anxiety Score 07/04/2021 04/24/2021 03/28/2021 11/29/2020  Nervous, Anxious, on Edge 0 0 0 0  Control/stop worrying 0 0 0 0  Worry too much - different things 0 0 0 0  Trouble relaxing 0 0 0 0  Restless 0 0 0 0  Easily annoyed or irritable 0 0 0 0  Afraid - awful might happen 0 0 0 0  Total GAD 7 Score 0 0 0 0  Anxiety Difficulty Not difficult at all - Not difficult at all Not difficult at all    BP Readings from Last 3 Encounters:  07/04/21 132/78  04/24/21 (!) 148/88  03/28/21 (!) 164/90    Physical Exam Vitals and nursing note reviewed.  Constitutional:      General: She is not in acute distress.    Appearance: Normal appearance. She is well-developed.  HENT:     Head: Normocephalic and atraumatic.  Cardiovascular:     Rate and Rhythm: Normal rate and regular rhythm.  Pulmonary:     Effort: Pulmonary effort is normal. No respiratory distress.     Breath sounds: No wheezing or rhonchi.  Musculoskeletal:     Cervical back: Normal range of motion.     Right lower leg: No edema.     Left lower leg: No edema.  Lymphadenopathy:     Cervical: No cervical adenopathy.  Skin:    General: Skin is warm and dry.     Findings: No rash.  Neurological:     Mental Status: She is alert and oriented to person, place, and time.  Psychiatric:        Mood and Affect: Mood normal.        Behavior: Behavior normal.    Wt Readings from Last 3 Encounters:  07/04/21 144 lb 6.4 oz (65.5 kg)  04/24/21 146 lb (66.2 kg)  03/28/21 144 lb (65.3 kg)    BP 132/78 (BP Location: Left  Arm, Cuff Size: Normal)    Pulse 78    Ht _1  (1.575 m)    Wt 144 lb 6.4 oz (65.5 kg)    SpO2 98%    BMI 26.41 kg/m   Assessment and Plan: 1. Essential hypertension Clinically stable exam with well controlled BP. Tolerating medications without side effects at this time. Pt to continue current regimen and low sodium diet; benefits of regular exercise as able  discussed.  2. Osteoarthritis of spine with radiculopathy, lumbar region Continue Tramadol and flexeril. No evidence of misuse or abuse.  3. Low grade squamous intraepithelial lesion on cytologic smear of cervix (LGSIL) With HPV negative 09/2020 - did not follow up with GYN Will repeat at [facility] in May this year.   Partially dictated using Editor, commissioning. Any errors are unintentional.  Halina Maidens, MD [facility]  07/04/2021

## 2021-07-24 ENCOUNTER — Other Ambulatory Visit: Payer: Self-pay | Admitting: Internal Medicine

## 2021-07-24 ENCOUNTER — Encounter: Payer: Self-pay | Admitting: Internal Medicine

## 2021-07-24 DIAGNOSIS — M5136 Other intervertebral disc degeneration, lumbar region: Secondary | ICD-10-CM

## 2021-07-24 DIAGNOSIS — I10 Essential (primary) hypertension: Secondary | ICD-10-CM

## 2021-07-24 MED ORDER — OLMESARTAN MEDOXOMIL 40 MG PO TABS: ORAL_TABLET | ORAL | 0 refills | Status: DC

## 2021-07-24 MED ORDER — AMLODIPINE BESYLATE 5 MG PO TABS: 5.0000 mg | ORAL_TABLET | Freq: Every day | ORAL | 0 refills | Status: DC

## 2021-07-25 NOTE — Telephone Encounter (Signed)
Please review. Last office visit 07/04/2021. ? ?KP

## 2021-07-25 NOTE — Telephone Encounter (Signed)
Requested medications are due for refill today.  yes ? ?Requested medications are on the active medications list.  yes ? ?Last refill. 05/15/2021 #180 1 refill ? ?Future visit scheduled.   yes ? ?Notes to clinic.  Medication not delegated. ? ? ? ?Requested Prescriptions  ?Pending Prescriptions Disp Refills  ? traMADol-acetaminophen (ULTRACET) 37.5-325 MG tablet [Pharmacy Med Name: TRAMADOL/APAP 37.5MG /325MG  TABS] 180 tablet   ?  Sig: TAKE 1 TO 2 TABLETS BY MOUTH EVERY 6 HOURS AS NEEDED  ?  ? Not Delegated - Analgesics:  Opioid Agonist Combinations - acetaminophen / tramadol Failed - 07/24/2021  6:47 PM  ?  ?  Failed - This refill cannot be delegated  ?  ?  Failed - Cr in normal range and within 360 days  ?  Creatinine, Ser  ?Date Value Ref Range Status  ?10/05/2020 1.05 (H) 0.57 - 1.00 mg/dL Final  ?  ?  ?  ?  Failed - Urine Drug Screen completed in last 360 days  ?  ?  Passed - AST in normal range and within 360 days  ?  AST  ?Date Value Ref Range Status  ?10/05/2020 11 0 - 40 IU/L Final  ?  ?  ?  ?  Passed - ALT in normal range and within 360 days  ?  ALT  ?Date Value Ref Range Status  ?10/05/2020 9 0 - 32 IU/L Final  ?  ?  ?  ?  Passed - Valid encounter within last 3 months  ?  Recent Outpatient Visits   ? ?      ? 3 weeks ago Essential hypertension  ? [facility] Glean Hess, MD  ? 3 months ago Essential hypertension  ? [facility] Glean Hess, MD  ? 3 months ago Greater trochanteric bursitis of left hip  ? [place], [place]  ? 7 months ago Chronic SI joint pain  ? [facility] Montel Culver, MD  ? 8 months ago Greater trochanteric bursitis of left hip  ? [facility] Montel Culver, MD  ? ?  ?  ?Future Appointments   ? ?        ? In 2 months [place] Melia Jesse Sans, MD [facility] [place]  ? ?  ? ?  ?  ?  ?  ?

## 2021-07-29 ENCOUNTER — Other Ambulatory Visit: Payer: Self-pay | Admitting: Internal Medicine

## 2021-07-29 DIAGNOSIS — M5136 Other intervertebral disc degeneration, lumbar region: Secondary | ICD-10-CM

## 2021-07-30 ENCOUNTER — Encounter: Payer: Self-pay | Admitting: Internal Medicine

## 2021-07-30 NOTE — Telephone Encounter (Signed)
Not on current med list. ?Requested Prescriptions  ?Pending Prescriptions Disp Refills  ?? meloxicam (MOBIC) 15 MG tablet [Pharmacy Med Name: MELOXICAM 15MG TABLETS] 90 tablet 0  ?  Sig: TAKE 1 TABLET(15 MG) BY MOUTH DAILY AS NEEDED FOR PAIN  ?  ? Analgesics:  COX2 Inhibitors Failed - 07/29/2021  3:28 AM  ?  ?  Failed - Manual Review: Labs are only required if the patient has taken medication for more than 8 weeks.  ?  ?  Failed - Cr in normal range and within 360 days  ?  Creatinine, Ser  ?Date Value Ref Range Status  ?10/05/2020 1.05 (H) 0.57 - 1.00 mg/dL Final  ?   ?  ?  Passed - HGB in normal range and within 360 days  ?  Hemoglobin  ?Date Value Ref Range Status  ?10/05/2020 12.7 11.1 - 15.9 g/dL Final  ?   ?  ?  Passed - HCT in normal range and within 360 days  ?  Hematocrit  ?Date Value Ref Range Status  ?10/05/2020 40.5 34.0 - 46.6 % Final  ?   ?  ?  Passed - AST in normal range and within 360 days  ?  AST  ?Date Value Ref Range Status  ?10/05/2020 11 0 - 40 IU/L Final  ?   ?  ?  Passed - ALT in normal range and within 360 days  ?  ALT  ?Date Value Ref Range Status  ?10/05/2020 9 0 - 32 IU/L Final  ?   ?  ?  Passed - eGFR is 30 or above and within 360 days  ?  GFR calc Af Amer  ?Date Value Ref Range Status  ?05/27/2019 84 >59 mL/min/1.73 Final  ? ?GFR calc non Af Amer  ?Date Value Ref Range Status  ?05/27/2019 73 >59 mL/min/1.73 Final  ? ?eGFR  ?Date Value Ref Range Status  ?10/05/2020 64 >59 mL/min/1.73 Final  ?   ?  ?  Passed - Patient is not pregnant  ?  ?  Passed - Valid encounter within last 12 months  ?  Recent Outpatient Visits   ?      ? 3 weeks ago Essential hypertension  ? [facility] Glean Hess, MD  ? 3 months ago Essential hypertension  ? [facility] Glean Hess, MD  ? 4 months ago Greater trochanteric bursitis of left hip  ? [place], [place]  ? 8 months ago Chronic SI joint pain  ? [facility] Montel Culver, MD  ? 8 months  ago Greater trochanteric bursitis of left hip  ? [facility] Montel Culver, MD  ?  ?  ?Future Appointments   ?        ? In 2 months Army Melia Jesse Sans, MD [facility] [place]  ?  ? ?  ?  ?  ? ? ?

## 2021-07-31 ENCOUNTER — Encounter: Payer: Self-pay | Admitting: Internal Medicine

## 2021-07-31 ENCOUNTER — Other Ambulatory Visit: Payer: Self-pay

## 2021-07-31 ENCOUNTER — Ambulatory Visit: Payer: BC Managed Care – PPO | Admitting: Internal Medicine

## 2021-07-31 DIAGNOSIS — I10 Essential (primary) hypertension: Secondary | ICD-10-CM

## 2021-07-31 MED ORDER — AMLODIPINE BESYLATE 10 MG PO TABS: 10.0000 mg | ORAL_TABLET | Freq: Every day | ORAL | 0 refills | Status: DC

## 2021-07-31 NOTE — Progress Notes (Signed)
? ? ?Date:  07/31/2021  ? ?Name:  April Fernandez   DOB:  [birth year]   MRN:  [id number] ? ? ?Chief Complaint: Hypertension ? ?Hypertension ?This is a chronic problem. The current episode started more than 1 year ago. The problem has been gradually worsening since onset. The problem is uncontrolled (170/80s last 2 days.). Associated symptoms include headaches. Pertinent negatives include no chest pain, palpitations or shortness of breath.  ?BP at work yesterday was 170 and she felt lightheaded and a slight headache.  She went home from work but her BP was still elevated today. ? ?Lab Results  ?Component Value Date  ? NA 137 10/05/2020  ? K 4.7 10/05/2020  ? CO2 25 10/05/2020  ? GLUCOSE 88 10/05/2020  ? BUN 7 10/05/2020  ? CREATININE 1.05 (H) 10/05/2020  ? CALCIUM 9.7 10/05/2020  ? EGFR 64 10/05/2020  ? GFRNONAA 73 05/27/2019  ? ?Lab Results  ?Component Value Date  ? CHOL 241 (H) 10/05/2020  ? HDL 52 10/05/2020  ? LDLCALC 176 (H) 10/05/2020  ? TRIG 75 10/05/2020  ? CHOLHDL 4.6 (H) 10/05/2020  ? ?Lab Results  ?Component Value Date  ? TSH 1.210 10/05/2020  ? ?No results found for: HGBA1C ?Lab Results  ?Component Value Date  ? WBC 4.9 10/05/2020  ? HGB 12.7 10/05/2020  ? HCT 40.5 10/05/2020  ? MCV 91 10/05/2020  ? PLT 291 10/05/2020  ? ?Lab Results  ?Component Value Date  ? ALT 9 10/05/2020  ? AST 11 10/05/2020  ? ALKPHOS 50 10/05/2020  ? BILITOT 0.5 10/05/2020  ? ?No results found for: 25OHVITD2, [place], VD25OH  ? ?Review of Systems  ?Constitutional:  Negative for chills, fatigue and fever.  ?HENT:  Negative for sinus pressure and trouble swallowing.   ?Respiratory:  Negative for cough, chest tightness and shortness of breath.   ?Cardiovascular:  Negative for chest pain, palpitations and leg swelling.  ?Neurological:  Positive for light-headedness and headaches. Negative for dizziness.  ? ?Patient Active Problem List  ? Diagnosis Date Noted  ? Chronic SI joint pain 11/03/2020  ? Greater trochanteric bursitis of left  hip 11/03/2020  ? Osteoarthritis of spine with radiculopathy, lumbar region 11/03/2020  ? Mixed hyperlipidemia 05/28/2019  ? Displacement of lumbar intervertebral disc without myelopathy 05/27/2019  ? Enthesopathy of hip region 05/27/2019  ? Fibromyositis 05/27/2019  ? Trigger finger of both hands 05/05/2015  ? Essential hypertension 01/09/2015  ? Degenerative disc disease, lumbar 01/09/2015  ? Allergic rhinitis 01/09/2015  ? Arthritis of hand 01/09/2015  ? Tobacco use disorder 01/09/2015  ? Arthralgia of hip or thigh 08/18/2009  ? Awareness of heartbeats 08/18/2009  ? ? ?Allergies  ?Allergen Reactions  ? Other Hives  ?  Tree Nuts  ? Shellfish Allergy Hives  ? Hydrochlorothiazide Palpitations  ? ? ?Past Surgical History:  ?Procedure Laterality Date  ? lumbar ESI  2007  ? ? ?Social History  ? ?Tobacco Use  ? Smoking status: Every Day  ?  Packs/day: 0.50  ?  Years: 34.00  ?  Pack years: 17.00  ?  Types: Cigarettes  ? Smokeless tobacco: Never  ?Vaping Use  ? Vaping Use: Former  ? Quit date: 05/27/2016  ?Substance Use Topics  ? Alcohol use: Not Currently  ? Drug use: Never  ? ? ? ?Medication list has been reviewed and updated. ? ?Current Meds  ?Medication Sig  ? cyclobenzaprine (FLEXERIL) 5 MG tablet Take 1 tablet (5 mg total) by mouth at bedtime as needed for  muscle spasms.  ? diclofenac (VOLTAREN) 0.1 % ophthalmic solution 4 (four) times daily.  ? diclofenac (VOLTAREN) 75 MG EC tablet Take 75 mg by mouth 2 (two) times daily.  ? olmesartan (BENICAR) 40 MG tablet TAKE 1 TABLET(40 MG) BY MOUTH DAILY  ? traMADol-acetaminophen (ULTRACET) 37.5-325 MG tablet TAKE 1 TO 2 TABLETS BY MOUTH EVERY 6 HOURS AS NEEDED  ? VIENVA 0.1-20 MG-MCG tablet TAKE 1 TABLET BY MOUTH DAILY  ? [DISCONTINUED] amLODipine (NORVASC) 5 MG tablet Take 1 tablet (5 mg total) by mouth daily.  ? ? ?PHQ 2/9 Scores 07/31/2021 07/04/2021 04/24/2021 03/28/2021  ?PHQ - 2 Score 0 0 2 0  ?PHQ- 9 Score _0 ? ? ?GAD 7 : Generalized Anxiety Score 07/31/2021 07/04/2021  04/24/2021 03/28/2021  ?Nervous, Anxious, on Edge 0 0 0 0  ?Control/stop worrying 0 0 0 0  ?Worry too much - different things 0 0 0 0  ?Trouble relaxing 0 0 0 0  ?Restless 0 0 0 0  ?Easily annoyed or irritable 0 0 0 0  ?Afraid - awful might happen 0 0 0 0  ?Total GAD 7 Score 0 0 0 0  ?Anxiety Difficulty Not difficult at all Not difficult at all - Not difficult at all  ? ? ?BP Readings from Last 3 Encounters:  ?07/31/21 (!) 142/86  ?07/04/21 132/78  ?04/24/21 (!) 148/88  ? ? ?Physical Exam ?Vitals and nursing note reviewed.  ?Constitutional:   ?   General: She is not in acute distress. ?   Appearance: Normal appearance. She is well-developed.  ?HENT:  ?   Head: Normocephalic and atraumatic.  ?Cardiovascular:  ?   Rate and Rhythm: Normal rate and regular rhythm.  ?   Pulses: Normal pulses.  ?   Heart sounds: No murmur heard. ?Pulmonary:  ?   Effort: Pulmonary effort is normal. No respiratory distress.  ?   Breath sounds: No wheezing or rhonchi.  ?Musculoskeletal:  ?   Cervical back: Normal range of motion.  ?   Right lower leg: No edema.  ?   Left lower leg: No edema.  ?Skin: ?   General: Skin is warm and dry.  ?   Findings: No rash.  ?Neurological:  ?   Mental Status: She is alert and oriented to person, place, and time.  ?Psychiatric:     ?   Mood and Affect: Mood normal.     ?   Behavior: Behavior normal.  ? ? ?Wt Readings from Last 3 Encounters:  ?07/31/21 141 lb 9.6 oz (64.2 kg)  ?07/04/21 144 lb 6.4 oz (65.5 kg)  ?04/24/21 146 lb (66.2 kg)  ? ? ?BP (!) 142/86 (BP Location: Left Arm, Cuff Size: Normal)   Pulse (!) 101   Ht _1  (1.575 m)   Wt 141 lb 9.6 oz (64.2 kg)   SpO2 98%   BMI 25.90 kg/m?  ? ?Assessment and Plan: ?1. Essential hypertension ?Not well controlled.  ?No obvious external factors - no diet change, increase in caffeine, no diet pills or stimulants. ?Will continue Olmesartan 40 mg. ?Increase amlodipine to 10 mg daily. ?Monitor at home - follow up in 2 months or sooner if needed ?- amLODipine  (NORVASC) 10 MG tablet; Take 1 tablet (10 mg total) by mouth daily.  Dispense: 90 tablet; Refill: 0 ? ? ?Partially dictated using Editor, commissioning. Any errors are unintentional. ? ?Halina Maidens, MD ?[facility] ?[facility] ? ?07/31/2021 ? ? ? ? ? ?

## 2021-07-31 NOTE — Patient Instructions (Signed)
Goal BP is 130/80 ? ?Too high over several days would be >170/100 ?

## 2021-08-17 ENCOUNTER — Other Ambulatory Visit: Payer: Self-pay

## 2021-08-17 ENCOUNTER — Encounter: Payer: Self-pay | Admitting: Internal Medicine

## 2021-08-17 ENCOUNTER — Other Ambulatory Visit: Payer: Self-pay | Admitting: Internal Medicine

## 2021-08-17 DIAGNOSIS — M5136 Other intervertebral disc degeneration, lumbar region: Secondary | ICD-10-CM

## 2021-08-17 MED ORDER — MELOXICAM 15 MG PO TABS: 15.0000 mg | ORAL_TABLET | Freq: Every day | ORAL | 1 refills | Status: DC

## 2021-08-20 NOTE — Telephone Encounter (Signed)
Requested Prescriptions  ?Pending Prescriptions Disp Refills  ?? traMADol-acetaminophen (ULTRACET) 37.5-325 MG tablet [Pharmacy Med Name: TRAMADOL/APAP 37.5MG/325MG TABS] 180 tablet   ?  Sig: TAKE 1 TO 2 TABLETS BY MOUTH EVERY 6 HOURS AS NEEDED  ?  ? Not Delegated - Analgesics:  Opioid Agonist Combinations - acetaminophen / tramadol Failed - 08/17/2021  1:56 PM  ?  ?  Failed - This refill cannot be delegated  ?  ?  Failed - Cr in normal range and within 360 days  ?  Creatinine, Ser  ?Date Value Ref Range Status  ?10/05/2020 1.05 (H) 0.57 - 1.00 mg/dL Final  ?   ?  ?  Failed - Urine Drug Screen completed in last 360 days  ?  ?  Passed - AST in normal range and within 360 days  ?  AST  ?Date Value Ref Range Status  ?10/05/2020 11 0 - 40 IU/L Final  ?   ?  ?  Passed - ALT in normal range and within 360 days  ?  ALT  ?Date Value Ref Range Status  ?10/05/2020 9 0 - 32 IU/L Final  ?   ?  ?  Passed - Valid encounter within last 3 months  ?  Recent Outpatient Visits   ?      ? 2 weeks ago Essential hypertension  ? [facility] Glean Hess, MD  ? 1 month ago Essential hypertension  ? [facility] Glean Hess, MD  ? 3 months ago Essential hypertension  ? [facility] Glean Hess, MD  ? 4 months ago Greater trochanteric bursitis of left hip  ? [place], [place]  ? 8 months ago Chronic SI joint pain  ? [facility] Montel Culver, MD  ?  ?  ?Future Appointments   ?        ? In 1 month Army Melia Jesse Sans, MD [facility] [place]  ?  ? ?  ?  ?  ?Refused Prescriptions Disp Refills  ?? meloxicam (MOBIC) 15 MG tablet [Pharmacy Med Name: MELOXICAM 15MG TABLETS] 90 tablet 0  ?  Sig: TAKE 1 TABLET(15 MG) BY MOUTH DAILY AS NEEDED FOR PAIN  ?  ? Analgesics:  COX2 Inhibitors Failed - 08/17/2021  1:56 PM  ?  ?  Failed - Manual Review: Labs are only required if the patient has taken medication for more than 8 weeks.  ?  ?  Failed - Cr in normal range and  within 360 days  ?  Creatinine, Ser  ?Date Value Ref Range Status  ?10/05/2020 1.05 (H) 0.57 - 1.00 mg/dL Final  ?   ?  ?  Passed - HGB in normal range and within 360 days  ?  Hemoglobin  ?Date Value Ref Range Status  ?10/05/2020 12.7 11.1 - 15.9 g/dL Final  ?   ?  ?  Passed - HCT in normal range and within 360 days  ?  Hematocrit  ?Date Value Ref Range Status  ?10/05/2020 40.5 34.0 - 46.6 % Final  ?   ?  ?  Passed - AST in normal range and within 360 days  ?  AST  ?Date Value Ref Range Status  ?10/05/2020 11 0 - 40 IU/L Final  ?   ?  ?  Passed - ALT in normal range and within 360 days  ?  ALT  ?Date Value Ref Range Status  ?10/05/2020 9 0 - 32  IU/L Final  ?   ?  ?  Passed - eGFR is 30 or above and within 360 days  ?  GFR calc Af Amer  ?Date Value Ref Range Status  ?05/27/2019 84 >59 mL/min/1.73 Final  ? ?GFR calc non Af Amer  ?Date Value Ref Range Status  ?05/27/2019 73 >59 mL/min/1.73 Final  ? ?eGFR  ?Date Value Ref Range Status  ?10/05/2020 64 >59 mL/min/1.73 Final  ?   ?  ?  Passed - Patient is not pregnant  ?  ?  Passed - Valid encounter within last 12 months  ?  Recent Outpatient Visits   ?      ? 2 weeks ago Essential hypertension  ? [facility] Glean Hess, MD  ? 1 month ago Essential hypertension  ? [facility] Glean Hess, MD  ? 3 months ago Essential hypertension  ? [facility] Glean Hess, MD  ? 4 months ago Greater trochanteric bursitis of left hip  ? [place], MD  ? 8 months ago Chronic SI joint pain  ? [facility] Montel Culver, MD  ?  ?  ?Future Appointments   ?        ? In 1 month Army Melia Jesse Sans, MD [facility] [place]  ?  ? ?  ?  ?  ? ? ?

## 2021-08-20 NOTE — Telephone Encounter (Signed)
Requested medication (s) are due for refill today: yes ? ?Requested medication (s) are on the active medication list: yes   ? ?Last refill: 07/25/21   #180 0 refills ? ?Future visit scheduled yes 10/09/21 ? ?Notes to clinic:   Not delegated, please review. Thank you ? ?Requested Prescriptions  ?Pending Prescriptions Disp Refills  ? traMADol-acetaminophen (ULTRACET) 37.5-325 MG tablet [Pharmacy Med Name: TRAMADOL/APAP 37.5MG/325MG TABS] 180 tablet   ?  Sig: TAKE 1 TO 2 TABLETS BY MOUTH EVERY 6 HOURS AS NEEDED  ?  ? Not Delegated - Analgesics:  Opioid Agonist Combinations - acetaminophen / tramadol Failed - 08/17/2021  1:56 PM  ?  ?  Failed - This refill cannot be delegated  ?  ?  Failed - Cr in normal range and within 360 days  ?  Creatinine, Ser  ?Date Value Ref Range Status  ?10/05/2020 1.05 (H) 0.57 - 1.00 mg/dL Final  ?  ?  ?  ?  Failed - Urine Drug Screen completed in last 360 days  ?  ?  Passed - AST in normal range and within 360 days  ?  AST  ?Date Value Ref Range Status  ?10/05/2020 11 0 - 40 IU/L Final  ?  ?  ?  ?  Passed - ALT in normal range and within 360 days  ?  ALT  ?Date Value Ref Range Status  ?10/05/2020 9 0 - 32 IU/L Final  ?  ?  ?  ?  Passed - Valid encounter within last 3 months  ?  Recent Outpatient Visits   ? ?      ? 2 weeks ago Essential hypertension  ? [facility] Glean Hess, MD  ? 1 month ago Essential hypertension  ? [facility] Glean Hess, MD  ? 3 months ago Essential hypertension  ? [facility] Glean Hess, MD  ? 4 months ago Greater trochanteric bursitis of left hip  ? [place], [place]  ? 8 months ago Chronic SI joint pain  ? [facility] Montel Culver, MD  ? ?  ?  ?Future Appointments   ? ?        ? In 1 month Army Melia Jesse Sans, MD [facility], [place]  ? ?  ? ?  ?  ?  ?Refused Prescriptions Disp Refills  ? meloxicam (MOBIC) 15 MG tablet [Pharmacy Med Name: MELOXICAM 15MG TABLETS] 90 tablet 0   ?  Sig: TAKE 1 TABLET(15 MG) BY MOUTH DAILY AS NEEDED FOR PAIN  ?  ? Analgesics:  COX2 Inhibitors Failed - 08/17/2021  1:56 PM  ?  ?  Failed - Manual Review: Labs are only required if the patient has taken medication for more than 8 weeks.  ?  ?  Failed - Cr in normal range and within 360 days  ?  Creatinine, Ser  ?Date Value Ref Range Status  ?10/05/2020 1.05 (H) 0.57 - 1.00 mg/dL Final  ?  ?  ?  ?  Passed - HGB in normal range and within 360 days  ?  Hemoglobin  ?Date Value Ref Range Status  ?10/05/2020 12.7 11.1 - 15.9 g/dL Final  ?  ?  ?  ?  Passed - HCT in normal range and within 360 days  ?  Hematocrit  ?Date Value Ref Range Status  ?10/05/2020 40.5 34.0 - 46.6 % Final  ?  ?  ?  ?  Passed -  AST in normal range and within 360 days  ?  AST  ?Date Value Ref Range Status  ?10/05/2020 11 0 - 40 IU/L Final  ?  ?  ?  ?  Passed - ALT in normal range and within 360 days  ?  ALT  ?Date Value Ref Range Status  ?10/05/2020 9 0 - 32 IU/L Final  ?  ?  ?  ?  Passed - eGFR is 30 or above and within 360 days  ?  GFR calc Af Amer  ?Date Value Ref Range Status  ?05/27/2019 84 >59 mL/min/1.73 Final  ? ?GFR calc non Af Amer  ?Date Value Ref Range Status  ?05/27/2019 73 >59 mL/min/1.73 Final  ? ?eGFR  ?Date Value Ref Range Status  ?10/05/2020 64 >59 mL/min/1.73 Final  ?  ?  ?  ?  Passed - Patient is not pregnant  ?  ?  Passed - Valid encounter within last 12 months  ?  Recent Outpatient Visits   ? ?      ? 2 weeks ago Essential hypertension  ? [facility] Glean Hess, MD  ? 1 month ago Essential hypertension  ? [facility] Glean Hess, MD  ? 3 months ago Essential hypertension  ? [facility] Glean Hess, MD  ? 4 months ago Greater trochanteric bursitis of left hip  ? [place], [place]  ? 8 months ago Chronic SI joint pain  ? [facility] Montel Culver, MD  ? ?  ?  ?Future Appointments   ? ?        ? In 1 month Army Melia Jesse Sans, MD [facility] [place]  ? ?  ? ?  ?  ?  ? ? ? ? ?

## 2021-08-20 NOTE — Telephone Encounter (Signed)
Please review. Last office visit 07/31/2021. ? ?KP

## 2021-09-24 ENCOUNTER — Other Ambulatory Visit: Payer: Self-pay | Admitting: Internal Medicine

## 2021-09-24 DIAGNOSIS — M5136 Other intervertebral disc degeneration, lumbar region: Secondary | ICD-10-CM

## 2021-09-24 NOTE — Telephone Encounter (Signed)
Please review RF. ?

## 2021-10-08 ENCOUNTER — Encounter: Payer: Self-pay | Admitting: Internal Medicine

## 2021-10-08 DIAGNOSIS — R87612 Low grade squamous intraepithelial lesion on cytologic smear of cervix (LGSIL): Secondary | ICD-10-CM | POA: Insufficient documentation

## 2021-10-09 ENCOUNTER — Encounter: Payer: Self-pay | Admitting: Internal Medicine

## 2021-10-09 ENCOUNTER — Ambulatory Visit (INDEPENDENT_AMBULATORY_CARE_PROVIDER_SITE_OTHER): Payer: BC Managed Care – PPO | Admitting: Internal Medicine

## 2021-10-09 ENCOUNTER — Other Ambulatory Visit (HOSPITAL_COMMUNITY)
Admission: RE | Admit: 2021-10-09 | Discharge: 2021-10-09 | Disposition: A | Discharge status: Home / Self Care | Payer: BC Managed Care – PPO | Source: Ambulatory Visit | Attending: Internal Medicine | Admitting: Internal Medicine

## 2021-10-09 VITALS — BP 124/64 | HR 74 | Ht 62.0 in | Wt 143.0 lb

## 2021-10-09 DIAGNOSIS — Z1231 Encounter for screening mammogram for malignant neoplasm of breast: Secondary | ICD-10-CM | POA: Diagnosis not present

## 2021-10-09 DIAGNOSIS — I10 Essential (primary) hypertension: Secondary | ICD-10-CM | POA: Diagnosis not present

## 2021-10-09 DIAGNOSIS — Z01419 Encounter for gynecological examination (general) (routine) without abnormal findings: Secondary | ICD-10-CM | POA: Insufficient documentation

## 2021-10-09 DIAGNOSIS — E782 Mixed hyperlipidemia: Secondary | ICD-10-CM | POA: Diagnosis not present

## 2021-10-09 DIAGNOSIS — R87610 Atypical squamous cells of undetermined significance on cytologic smear of cervix (ASC-US): Secondary | ICD-10-CM | POA: Diagnosis not present

## 2021-10-09 DIAGNOSIS — Z0000 Encounter for general adult medical examination without abnormal findings: Secondary | ICD-10-CM | POA: Diagnosis not present

## 2021-10-09 DIAGNOSIS — R87612 Low grade squamous intraepithelial lesion on cytologic smear of cervix (LGSIL): Secondary | ICD-10-CM

## 2021-10-09 DIAGNOSIS — M5136 Other intervertebral disc degeneration, lumbar region: Secondary | ICD-10-CM

## 2021-10-09 DIAGNOSIS — Z8742 Personal history of other diseases of the female genital tract: Secondary | ICD-10-CM | POA: Diagnosis not present

## 2021-10-09 DIAGNOSIS — F17200 Nicotine dependence, unspecified, uncomplicated: Secondary | ICD-10-CM

## 2021-10-09 LAB — POCT URINALYSIS DIPSTICK
Bilirubin, UA: NEGATIVE
Glucose, UA: NEGATIVE
Ketones, UA: NEGATIVE
Leukocytes, UA: NEGATIVE
Nitrite, UA: NEGATIVE
Protein, UA: NEGATIVE
Spec Grav, UA: 1.015 (ref 1.010–1.025)
Urobilinogen, UA: 0.2 E.U./dL
pH, UA: 6 (ref 5.0–8.0)

## 2021-10-09 MED ORDER — OLMESARTAN MEDOXOMIL 40 MG PO TABS: ORAL_TABLET | ORAL | 1 refills | Status: DC

## 2021-10-09 MED ORDER — AMLODIPINE BESYLATE 10 MG PO TABS: 10.0000 mg | ORAL_TABLET | Freq: Every day | ORAL | 1 refills | Status: DC

## 2021-10-09 MED ORDER — MELOXICAM 15 MG PO TABS: 15.0000 mg | ORAL_TABLET | Freq: Every day | ORAL | 1 refills | Status: DC

## 2021-10-09 NOTE — Progress Notes (Signed)
? ? ?Date:  10/09/2021  ? ?Name:  April Fernandez   DOB:  July 18, 1970   MRN:  [id number] ? ? ?Chief Complaint: Annual Exam ?Lyndsie Wallman is a 51 y.o. female who presents today for her Complete Annual Exam. She feels well. She reports not exercising. She reports she is sleeping well. Breast complaints - none. ? ?Mammogram: none ?DEXA: none ?Pap smear: LSIL with HPV - pt was to schedule her own GYN appt but did not ?Colonoscopy: Cologuard 10/2020 ? ?Health Maintenance Due  ?Topic Date Due  ? MAMMOGRAM  Never done  ? COVID-19 Vaccine (4 - Booster for Moderna series) 08/29/2020  ?  ?Immunization History  ?Administered Date(s) Administered  ? Hepatitis A, Adult 05/27/2008  ? Influenza,inj,Quad PF,6+ Mos 03/05/2018, 04/09/2021  ? Influenza-Unspecified 03/31/2019, 03/15/2020  ? Moderna Sars-Covid-2 Vaccination 09/18/2019, 10/16/2019  ? PFIZER Comirnaty(Gray Top)Covid-19 Tri-Sucrose Vaccine 07/04/2020  ? Tdap 12/08/2014  ? Zoster Recombinat (Shingrix) 08/06/2019, 04/17/2020  ? ? ?Hypertension ?This is a chronic problem. The problem is controlled. Pertinent negatives include no chest pain, headaches, palpitations or shortness of breath. Past treatments include angiotensin blockers and calcium channel blockers. The current treatment provides significant improvement. There is no history of kidney disease, CAD/MI or CVA.  ?Back Pain ?This is a chronic problem. The problem occurs daily. The problem is unchanged. The pain is present in the lumbar spine. The quality of the pain is described as aching and burning. Pertinent negatives include no abdominal pain, chest pain, dysuria, fever or headaches. She has tried analgesics for the symptoms. The treatment provided moderate relief.  ? ?Lab Results  ?Component Value Date  ? NA 137 10/05/2020  ? K 4.7 10/05/2020  ? CO2 25 10/05/2020  ? GLUCOSE 88 10/05/2020  ? BUN 7 10/05/2020  ? CREATININE 1.05 (H) 10/05/2020  ? CALCIUM 9.7 10/05/2020  ? EGFR 64 10/05/2020  ? GFRNONAA 73 05/27/2019   ? ?Lab Results  ?Component Value Date  ? CHOL 241 (H) 10/05/2020  ? HDL 52 10/05/2020  ? LDLCALC 176 (H) 10/05/2020  ? TRIG 75 10/05/2020  ? CHOLHDL 4.6 (H) 10/05/2020  ? ?Lab Results  ?Component Value Date  ? TSH 1.210 10/05/2020  ? ?No results found for: HGBA1C ?Lab Results  ?Component Value Date  ? WBC 4.9 10/05/2020  ? HGB 12.7 10/05/2020  ? HCT 40.5 10/05/2020  ? MCV 91 10/05/2020  ? PLT 291 10/05/2020  ? ?Lab Results  ?Component Value Date  ? ALT 9 10/05/2020  ? AST 11 10/05/2020  ? ALKPHOS 50 10/05/2020  ? BILITOT 0.5 10/05/2020  ? ?No results found for: 25OHVITD2, [place], VD25OH  ? ?Review of Systems  ?Constitutional:  Negative for chills, fatigue and fever.  ?HENT:  Negative for congestion, hearing loss, tinnitus, trouble swallowing and voice change.   ?Eyes:  Negative for visual disturbance.  ?Respiratory:  Negative for cough, chest tightness, shortness of breath and wheezing.   ?Cardiovascular:  Negative for chest pain, palpitations and leg swelling.  ?Gastrointestinal:  Negative for abdominal pain, constipation, diarrhea and vomiting.  ?Endocrine: Negative for polydipsia and polyuria.  ?Genitourinary:  Negative for dysuria, frequency, genital sores, vaginal bleeding and vaginal discharge.  ?Musculoskeletal:  Positive for back pain. Negative for arthralgias, gait problem and joint swelling.  ?Skin:  Negative for color change and rash.  ?Neurological:  Negative for dizziness, tremors, light-headedness and headaches.  ?Hematological:  Negative for adenopathy. Does not bruise/bleed easily.  ?Psychiatric/Behavioral:  Negative for dysphoric mood and sleep disturbance. The patient is  not nervous/anxious.   ? ?Patient Active Problem List  ? Diagnosis Date Noted  ? LGSIL on Pap smear of cervix 10/08/2021  ? Chronic SI joint pain 11/03/2020  ? Greater trochanteric bursitis of left hip 11/03/2020  ? Osteoarthritis of spine with radiculopathy, lumbar region 11/03/2020  ? Mixed hyperlipidemia 05/28/2019  ?  Displacement of lumbar intervertebral disc without myelopathy 05/27/2019  ? Enthesopathy of hip region 05/27/2019  ? Fibromyositis 05/27/2019  ? Trigger finger of both hands 05/05/2015  ? Essential hypertension 01/09/2015  ? Degenerative disc disease, lumbar 01/09/2015  ? Allergic rhinitis 01/09/2015  ? Arthritis of hand 01/09/2015  ? Tobacco use disorder 01/09/2015  ? Arthralgia of hip or thigh 08/18/2009  ? Awareness of heartbeats 08/18/2009  ? ? ?Allergies  ?Allergen Reactions  ? Other Hives  ?  Tree Nuts  ? Shellfish Allergy Hives  ? Hydrochlorothiazide Palpitations  ? ? ?Past Surgical History:  ?Procedure Laterality Date  ? lumbar ESI  2007  ? ? ?Social History  ? ?Tobacco Use  ? Smoking status: Every Day  ?  Packs/day: 0.50  ?  Years: 34.00  ?  Pack years: 17.00  ?  Types: Cigarettes  ? Smokeless tobacco: Never  ?Vaping Use  ? Vaping Use: Former  ? Quit date: 05/27/2016  ?Substance Use Topics  ? Alcohol use: Not Currently  ? Drug use: Never  ? ? ? ?Medication list has been reviewed and updated. ? ?Current Meds  ?Medication Sig  ? cyclobenzaprine (FLEXERIL) 5 MG tablet Take 1 tablet (5 mg total) by mouth at bedtime as needed for muscle spasms.  ? diclofenac (VOLTAREN) 0.1 % ophthalmic solution 4 (four) times daily.  ? diclofenac (VOLTAREN) 75 MG EC tablet Take 75 mg by mouth 2 (two) times daily.  ? meloxicam (MOBIC) 15 MG tablet Take 1 tablet (15 mg total) by mouth daily.  ? olmesartan (BENICAR) 40 MG tablet TAKE 1 TABLET(40 MG) BY MOUTH DAILY  ? traMADol-acetaminophen (ULTRACET) 37.5-325 MG tablet TAKE 1 TO 2 TABLETS BY MOUTH EVERY 6 HOURS AS NEEDED  ? VIENVA 0.1-20 MG-MCG tablet TAKE 1 TABLET BY MOUTH DAILY  ? ? ? ?  10/09/2021  ? 10:09 AM 07/31/2021  ?  3:44 PM 07/04/2021  ? 10:25 AM 04/24/2021  ? 11:05 AM  ?GAD 7 : Generalized Anxiety Score  ?Nervous, Anxious, on Edge 0 0 0 0  ?Control/stop worrying 0 0 0 0  ?Worry too much - different things 0 0 0 0  ?Trouble relaxing 0 0 0 0  ?Restless 0 0 0 0  ?Easily annoyed  or irritable 0 0 0 0  ?Afraid - awful might happen 0 0 0 0  ?Total GAD 7 Score 0 0 0 0  ?Anxiety Difficulty Not difficult at all Not difficult at all Not difficult at all   ? ? ? ?  10/09/2021  ? 10:09 AM  ?Depression screen PHQ 2/9  ?Decreased Interest 0  ?Down, Depressed, Hopeless 0  ?PHQ - 2 Score 0  ?Altered sleeping 2  ?Tired, decreased energy 2  ?Change in appetite 0  ?Feeling bad or failure about yourself  0  ?Trouble concentrating 0  ?Moving slowly or fidgety/restless 0  ?Suicidal thoughts 0  ?PHQ-9 Score 4  ?Difficult doing work/chores Not difficult at all  ? ? ?BP Readings from Last 3 Encounters:  ?10/09/21 124/64  ?07/31/21 (!) 142/86  ?07/04/21 132/78  ? ? ?Physical Exam ?Vitals and nursing note reviewed.  ?Constitutional:   ?  General: She is not in acute distress. ?   Appearance: She is well-developed.  ?HENT:  ?   Head: Normocephalic and atraumatic.  ?   Right Ear: Tympanic membrane and ear canal normal.  ?   Left Ear: Tympanic membrane and ear canal normal.  ?   Nose:  ?   Right Sinus: No maxillary sinus tenderness.  ?   Left Sinus: No maxillary sinus tenderness.  ?Eyes:  ?   General: No scleral icterus.    ?   Right eye: No discharge.     ?   Left eye: No discharge.  ?   Conjunctiva/sclera: Conjunctivae normal.  ?Neck:  ?   Thyroid: No thyromegaly.  ?   Vascular: No carotid bruit.  ?Cardiovascular:  ?   Rate and Rhythm: Normal rate and regular rhythm.  ?   Pulses: Normal pulses.  ?   Heart sounds: Normal heart sounds.  ?Pulmonary:  ?   Effort: Pulmonary effort is normal. No respiratory distress.  ?   Breath sounds: No wheezing.  ?Chest:  ?Breasts: ?   Right: No mass, nipple discharge, skin change or tenderness.  ?   Left: No mass, nipple discharge, skin change or tenderness.  ?Abdominal:  ?   General: Bowel sounds are normal.  ?   Palpations: Abdomen is soft.  ?   Tenderness: There is no abdominal tenderness.  ?Genitourinary: ?   Labia:     ?   Right: No tenderness, lesion or injury.     ?   Left:  No tenderness, lesion or injury.   ?   Vagina: Normal.  ?   Cervix: Erythema present. No discharge, lesion or cervical bleeding.  ?   Uterus: Normal.   ?   Adnexa: Right adnexa normal and left adnexa normal.

## 2021-10-10 LAB — CBC WITH DIFFERENTIAL/PLATELET
Basophils Absolute: 0 10*3/uL (ref 0.0–0.2)
Basos: 0 %
EOS (ABSOLUTE): 0.1 10*3/uL (ref 0.0–0.4)
Eos: 1 %
Hematocrit: 37.7 % (ref 34.0–46.6)
Hemoglobin: 12.3 g/dL (ref 11.1–15.9)
Immature Grans (Abs): 0 10*3/uL (ref 0.0–0.1)
Immature Granulocytes: 0 %
Lymphocytes Absolute: 1.8 10*3/uL (ref 0.7–3.1)
Lymphs: 36 %
MCH: 29.4 pg (ref 26.6–33.0)
MCHC: 32.6 g/dL (ref 31.5–35.7)
MCV: 90 fL (ref 79–97)
Monocytes Absolute: 0.4 10*3/uL (ref 0.1–0.9)
Monocytes: 8 %
Neutrophils Absolute: 2.7 10*3/uL (ref 1.4–7.0)
Neutrophils: 55 %
Platelets: 309 10*3/uL (ref 150–450)
RBC: 4.18 x10E6/uL (ref 3.77–5.28)
RDW: 11.8 % (ref 11.7–15.4)
WBC: 4.9 10*3/uL (ref 3.4–10.8)

## 2021-10-10 LAB — LIPID PANEL
Chol/HDL Ratio: 4.3 ratio (ref 0.0–4.4)
Cholesterol, Total: 210 mg/dL — ABNORMAL HIGH (ref 100–199)
HDL: 49 mg/dL (ref >39)
LDL Chol Calc (NIH): 150 mg/dL — ABNORMAL HIGH (ref 0–99)
Triglycerides: 63 mg/dL (ref 0–149)
VLDL Cholesterol Cal: 11 mg/dL (ref 5–40)

## 2021-10-10 LAB — COMPREHENSIVE METABOLIC PANEL
ALT: 11 IU/L (ref 0–32)
AST: 12 IU/L (ref 0–40)
Albumin/Globulin Ratio: 2 (ref 1.2–2.2)
Albumin: 4.5 g/dL (ref 3.8–4.9)
Alkaline Phosphatase: 53 IU/L (ref 44–121)
BUN/Creatinine Ratio: 12 (ref 9–23)
BUN: 10 mg/dL (ref 6–24)
Bilirubin Total: 0.5 mg/dL (ref 0.0–1.2)
CO2: 21 mmol/L (ref 20–29)
Calcium: 9.6 mg/dL (ref 8.7–10.2)
Chloride: 105 mmol/L (ref 96–106)
Creatinine, Ser: 0.86 mg/dL (ref 0.57–1.00)
Globulin, Total: 2.3 g/dL (ref 1.5–4.5)
Glucose: 83 mg/dL (ref 70–99)
Potassium: 4.2 mmol/L (ref 3.5–5.2)
Sodium: 140 mmol/L (ref 134–144)
Total Protein: 6.8 g/dL (ref 6.0–8.5)
eGFR: 81 mL/min/{1.73_m2} (ref >59)

## 2021-10-10 LAB — TSH: TSH: 0.9 u[IU]/mL (ref 0.450–4.500)

## 2021-10-10 LAB — HEMOGLOBIN A1C
Est. average glucose Bld gHb Est-mCnc: 117 mg/dL
Hgb A1c MFr Bld: 5.7 % — ABNORMAL HIGH (ref 4.8–5.6)

## 2021-10-11 LAB — CYTOLOGY - PAP
Chlamydia: NEGATIVE
Comment: NEGATIVE
Comment: NEGATIVE
Comment: NORMAL
Diagnosis: UNDETERMINED — AB
High risk HPV: NEGATIVE
Neisseria Gonorrhea: NEGATIVE

## 2021-10-12 ENCOUNTER — Other Ambulatory Visit: Payer: Self-pay | Admitting: Internal Medicine

## 2021-10-12 DIAGNOSIS — N760 Acute vaginitis: Secondary | ICD-10-CM

## 2021-10-12 DIAGNOSIS — B3731 Acute candidiasis of vulva and vagina: Secondary | ICD-10-CM

## 2021-10-12 MED ORDER — METRONIDAZOLE 500 MG PO TABS: 500.0000 mg | ORAL_TABLET | Freq: Two times a day (BID) | ORAL | 0 refills | Status: AC

## 2021-10-12 MED ORDER — FLUCONAZOLE 100 MG PO TABS: 100.0000 mg | ORAL_TABLET | Freq: Every day | ORAL | 0 refills | Status: AC

## 2021-10-19 ENCOUNTER — Other Ambulatory Visit: Payer: Self-pay | Admitting: Internal Medicine

## 2021-10-19 DIAGNOSIS — M5136 Other intervertebral disc degeneration, lumbar region: Secondary | ICD-10-CM

## 2021-10-19 MED ORDER — TRAMADOL-ACETAMINOPHEN 37.5-325 MG PO TABS: 1.0000 | ORAL_TABLET | Freq: Four times a day (QID) | ORAL | 0 refills | Status: DC | PRN

## 2021-10-19 NOTE — Telephone Encounter (Signed)
Requested medication (s) are due for refill today - no  Requested medication (s) are on the active medication list -yes  Future visit scheduled -no  Last refill: ordered today for 10/22/21 #180  Notes to clinic: non delegated Rx  Requested Prescriptions  Pending Prescriptions Disp Refills   traMADol-acetaminophen (ULTRACET) 37.5-325 MG tablet [Pharmacy Med Name: TRAMADOL/APAP 37.5MG /325MG  TABS] 180 tablet     Sig: TAKE 1 TO 2 TABLETS BY MOUTH EVERY 6 HOURS AS NEEDED     Not Delegated - Analgesics:  Opioid Agonist Combinations - acetaminophen / tramadol Failed - 10/19/2021 11:42 AM      Failed - This refill cannot be delegated      Failed - Urine Drug Screen completed in last 360 days      Passed - Cr in normal range and within 360 days    Creatinine, Ser  Date Value Ref Range Status  10/09/2021 0.86 0.57 - 1.00 mg/dL Final         Passed - AST in normal range and within 360 days    AST  Date Value Ref Range Status  10/09/2021 12 0 - 40 IU/L Final         Passed - ALT in normal range and within 360 days    ALT  Date Value Ref Range Status  10/09/2021 11 0 - 32 IU/L Final         Passed - Valid encounter within last 3 months    Recent Outpatient Visits           1 week ago Annual physical exam   [facility] Reubin Milan, MD   2 months ago Essential hypertension   [facility] Reubin Milan, MD   3 months ago Essential hypertension   [facility] Reubin Milan, MD   5 months ago Essential hypertension   [facility] Reubin Milan, MD   6 months ago Greater trochanteric bursitis of left hip   [facility] Jerrol Banana, MD                  Requested Prescriptions  Pending Prescriptions Disp Refills   traMADol-acetaminophen (ULTRACET) 37.5-325 MG tablet [Pharmacy Med Name: TRAMADOL/APAP 37.5MG /325MG  TABS] 180 tablet     Sig: TAKE 1 TO 2 TABLETS BY MOUTH EVERY 6 HOURS AS NEEDED     Not  Delegated - Analgesics:  Opioid Agonist Combinations - acetaminophen / tramadol Failed - 10/19/2021 11:42 AM      Failed - This refill cannot be delegated      Failed - Urine Drug Screen completed in last 360 days      Passed - Cr in normal range and within 360 days    Creatinine, Ser  Date Value Ref Range Status  10/09/2021 0.86 0.57 - 1.00 mg/dL Final         Passed - AST in normal range and within 360 days    AST  Date Value Ref Range Status  10/09/2021 12 0 - 40 IU/L Final         Passed - ALT in normal range and within 360 days    ALT  Date Value Ref Range Status  10/09/2021 11 0 - 32 IU/L Final         Passed - Valid encounter within last 3 months    Recent Outpatient Visits           1 week ago Annual physical exam  [facility] Reubin Milan, MD   2 months ago Essential hypertension   [facility] Reubin Milan, MD   3 months ago Essential hypertension   [facility] Reubin Milan, MD   5 months ago Essential hypertension   [facility] Reubin Milan, MD   6 months ago Greater trochanteric bursitis of left hip   [facility] Jerrol Banana, MD

## 2021-10-28 ENCOUNTER — Other Ambulatory Visit: Payer: Self-pay | Admitting: Internal Medicine

## 2021-10-28 DIAGNOSIS — I10 Essential (primary) hypertension: Secondary | ICD-10-CM

## 2021-10-29 NOTE — Telephone Encounter (Signed)
Refilled 10/09/2021 #90 1 refill - receipt conformed by pharmacy. Requested Prescriptions  Pending Prescriptions Disp Refills  . olmesartan (BENICAR) 40 MG tablet [Pharmacy Med Name: OLMESARTAN MEDOXOMIL 40MG  TABLETS] 90 tablet 1    Sig: TAKE 1 TABLET(40 MG) BY MOUTH DAILY     Cardiovascular:  Angiotensin Receptor Blockers Passed - 10/28/2021 11:04 AM      Passed - Cr in normal range and within 180 days    Creatinine, Ser  Date Value Ref Range Status  10/09/2021 0.86 0.57 - 1.00 mg/dL Final         Passed - K in normal range and within 180 days    Potassium  Date Value Ref Range Status  10/09/2021 4.2 3.5 - 5.2 mmol/L Final         Passed - Patient is not pregnant      Passed - Last BP in normal range    BP Readings from Last 1 Encounters:  10/09/21 124/64         Passed - Valid encounter within last 6 months    Recent Outpatient Visits          2 weeks ago Annual physical exam   [facility] Glean Hess, MD   3 months ago Essential hypertension   [facility] Glean Hess, MD   3 months ago Essential hypertension   [facility] Glean Hess, MD   6 months ago Essential hypertension   [facility] Glean Hess, MD   7 months ago Greater trochanteric bursitis of left hip   [facility] Montel Culver, MD

## 2021-11-07 ENCOUNTER — Other Ambulatory Visit: Payer: Self-pay

## 2021-11-07 ENCOUNTER — Encounter: Payer: Self-pay | Admitting: Internal Medicine

## 2021-11-07 DIAGNOSIS — B9689 Other specified bacterial agents as the cause of diseases classified elsewhere: Secondary | ICD-10-CM

## 2021-11-07 DIAGNOSIS — B3731 Acute candidiasis of vulva and vagina: Secondary | ICD-10-CM

## 2021-11-07 MED ORDER — FLUCONAZOLE 150 MG PO TABS: 150.0000 mg | ORAL_TABLET | Freq: Once | ORAL | 0 refills | Status: AC

## 2021-11-15 ENCOUNTER — Other Ambulatory Visit: Payer: Self-pay | Admitting: Internal Medicine

## 2021-11-15 DIAGNOSIS — M5136 Other intervertebral disc degeneration, lumbar region: Secondary | ICD-10-CM

## 2021-11-26 ENCOUNTER — Encounter: Payer: Self-pay | Admitting: Internal Medicine

## 2021-11-26 NOTE — Telephone Encounter (Signed)
Please review.  KP

## 2021-12-03 ENCOUNTER — Encounter: Payer: Self-pay | Admitting: Internal Medicine

## 2021-12-08 ENCOUNTER — Other Ambulatory Visit: Payer: Self-pay | Admitting: Internal Medicine

## 2021-12-08 DIAGNOSIS — Z0000 Encounter for general adult medical examination without abnormal findings: Secondary | ICD-10-CM

## 2021-12-10 ENCOUNTER — Other Ambulatory Visit: Payer: Self-pay

## 2021-12-10 ENCOUNTER — Encounter: Payer: Self-pay | Admitting: Internal Medicine

## 2021-12-10 DIAGNOSIS — Z0000 Encounter for general adult medical examination without abnormal findings: Secondary | ICD-10-CM

## 2021-12-10 MED ORDER — LEVONORGESTREL-ETHINYL ESTRAD 0.1-20 MG-MCG PO TABS: 1.0000 | ORAL_TABLET | Freq: Every day | ORAL | 2 refills | Status: DC

## 2021-12-10 NOTE — Telephone Encounter (Signed)
Rx filled today- 12/10/21 #84 2RF Requested Prescriptions  Pending Prescriptions Disp Refills  . VIENVA 0.1-20 MG-MCG tablet [Pharmacy Med Name: VIENVA 0.1MG /0.02MG  TABS 28S] 84 tablet 2    Sig: TAKE 1 TABLET BY MOUTH DAILY     OB/GYN:  Contraceptives Failed - 12/08/2021 12:46 PM      Failed - Patient is not a smoker      Passed - Last BP in normal range    BP Readings from Last 1 Encounters:  10/09/21 124/64         Passed - Valid encounter within last 12 months    Recent Outpatient Visits          2 months ago Annual physical exam   [facility] Reubin Milan, MD   4 months ago Essential hypertension   [facility] Reubin Milan, MD   5 months ago Essential hypertension   [facility] Reubin Milan, MD   7 months ago Essential hypertension   [facility] Reubin Milan, MD   8 months ago Greater trochanteric bursitis of left hip   [facility] Jerrol Banana, MD

## 2021-12-28 ENCOUNTER — Other Ambulatory Visit: Payer: Self-pay | Admitting: Internal Medicine

## 2021-12-28 DIAGNOSIS — M5136 Other intervertebral disc degeneration, lumbar region: Secondary | ICD-10-CM

## 2021-12-28 NOTE — Telephone Encounter (Signed)
Requested medication (s) are due for refill today: yes  Requested medication (s) are on the active medication list: yes  Last refill:  11/22/21 #180/0  Future visit scheduled: no, had annual exam 2 month ago  Notes to clinic:  Unable to refill per protocol, cannot delegate.      Requested Prescriptions  Pending Prescriptions Disp Refills   traMADol-acetaminophen (ULTRACET) 37.5-325 MG tablet [Pharmacy Med Name: TRAMADOL/APAP 37.5MG /325MG  TABS] 180 tablet     Sig: TAKE 1 TO 2 TABLETS BY MOUTH EVERY 6 HOURS AS NEEDED     Not Delegated - Analgesics:  Opioid Agonist Combinations - acetaminophen / tramadol Failed - 12/28/2021 10:49 AM      Failed - This refill cannot be delegated      Failed - Urine Drug Screen completed in last 360 days      Passed - Cr in normal range and within 360 days    Creatinine, Ser  Date Value Ref Range Status  10/09/2021 0.86 0.57 - 1.00 mg/dL Final         Passed - AST in normal range and within 360 days    AST  Date Value Ref Range Status  10/09/2021 12 0 - 40 IU/L Final         Passed - ALT in normal range and within 360 days    ALT  Date Value Ref Range Status  10/09/2021 11 0 - 32 IU/L Final         Passed - Valid encounter within last 3 months    Recent Outpatient Visits           2 months ago Annual physical exam   [facility] Reubin Milan, MD   5 months ago Essential hypertension   [facility] Reubin Milan, MD   5 months ago Essential hypertension   [facility] Reubin Milan, MD   8 months ago Essential hypertension   [facility] Reubin Milan, MD   9 months ago Greater trochanteric bursitis of left hip   [facility] Jerrol Banana, MD

## 2021-12-28 NOTE — Telephone Encounter (Signed)
Please advise 

## 2022-01-21 ENCOUNTER — Encounter: Payer: Self-pay | Admitting: Internal Medicine

## 2022-01-23 ENCOUNTER — Ambulatory Visit: Payer: BC Managed Care – PPO | Admitting: Internal Medicine

## 2022-02-08 ENCOUNTER — Other Ambulatory Visit: Payer: Self-pay | Admitting: Internal Medicine

## 2022-02-08 DIAGNOSIS — I10 Essential (primary) hypertension: Secondary | ICD-10-CM

## 2022-02-11 NOTE — Telephone Encounter (Signed)
Not on current med list. Requested Prescriptions  Pending Prescriptions Disp Refills  . olmesartan-hydrochlorothiazide (BENICAR HCT) 40-25 MG tablet [Pharmacy Med Name: OLMESARTAN MEDOX/HCTZ 40-25MG TAB] 90 tablet 0    Sig: TAKE 1 TABLET BY MOUTH DAILY     Cardiovascular: ARB + Diuretic Combos Passed - 02/08/2022  4:44 PM      Passed - K in normal range and within 180 days    Potassium  Date Value Ref Range Status  10/09/2021 4.2 3.5 - 5.2 mmol/L Final         Passed - Na in normal range and within 180 days    Sodium  Date Value Ref Range Status  10/09/2021 140 134 - 144 mmol/L Final         Passed - Cr in normal range and within 180 days    Creatinine, Ser  Date Value Ref Range Status  10/09/2021 0.86 0.57 - 1.00 mg/dL Final         Passed - eGFR is 10 or above and within 180 days    GFR calc Af Amer  Date Value Ref Range Status  05/27/2019 84 >59 mL/min/1.73 Final   GFR calc non Af Amer  Date Value Ref Range Status  05/27/2019 73 >59 mL/min/1.73 Final   eGFR  Date Value Ref Range Status  10/09/2021 81 >59 mL/min/1.73 Final         Passed - Patient is not pregnant      Passed - Last BP in normal range    BP Readings from Last 1 Encounters:  10/09/21 124/64         Passed - Valid encounter within last 6 months    Recent Outpatient Visits          4 months ago Annual physical exam   [facility], Jesse Sans, MD   6 months ago Essential hypertension   [place] [facility], Jesse Sans, MD   7 months ago Essential hypertension   [place] [facility], Jesse Sans, MD   9 months ago Essential hypertension    [facility] at [facility], Jesse Sans, MD   10 months ago Greater trochanteric bursitis of left hip   [facility], Earley Abide, MD      Future Appointments            In 1 month Army Melia, Jesse Sans, MD [facility]

## 2022-02-25 ENCOUNTER — Other Ambulatory Visit: Payer: Self-pay | Admitting: Internal Medicine

## 2022-02-25 DIAGNOSIS — M5136 Other intervertebral disc degeneration, lumbar region: Secondary | ICD-10-CM

## 2022-02-26 NOTE — Telephone Encounter (Signed)
Requested medication (s) are due for refill today- yes  Requested medication (s) are on the active medication list -yes  Future visit scheduled -yes  Last refill: 12/28/21 #180 1RF  Notes to clinic: non delegated Rx  Requested Prescriptions  Pending Prescriptions Disp Refills   traMADol-acetaminophen (ULTRACET) 37.5-325 MG tablet [Pharmacy Med Name: TRAMADOL/APAP 37.5MG /325MG  TABS] 180 tablet     Sig: TAKE 1 TO 2 TABLETS BY MOUTH EVERY 6 HOURS AS NEEDED     Not Delegated - Analgesics:  Opioid Agonist Combinations - acetaminophen / tramadol Failed - 02/25/2022  1:52 PM      Failed - This refill cannot be delegated      Failed - Urine Drug Screen completed in last 360 days      Failed - Valid encounter within last 3 months    Recent Outpatient Visits           4 months ago Annual physical exam   [place] [facility] at [facility], Jesse Sans, MD   7 months ago Essential hypertension   [place] [facility] at [facility], Jesse Sans, MD   7 months ago Essential hypertension   [place] and Sports Medicine at [facility], Jesse Sans, MD   10 months ago Essential hypertension   [place] [facility] at [facility], Jesse Sans, MD   11 months ago Greater trochanteric bursitis of left hip   [place] [facility] at [facility], Earley Abide, MD       Future Appointments             In 1 month Army Melia Jesse Sans, MD [facility] Primary Care and Sports Medicine at [facility], [place] in normal range and within 360 days    Creatinine, Ser  Date Value Ref Range Status  10/09/2021 0.86 0.57 - 1.00 mg/dL Final         Passed - AST in normal range and within 360 days    AST  Date Value Ref Range Status  10/09/2021 12 0 - 40 IU/L Final         Passed - ALT in normal range and within 360 days     ALT  Date Value Ref Range Status  10/09/2021 11 0 - 32 IU/L Final            Requested Prescriptions  Pending Prescriptions Disp Refills   traMADol-acetaminophen (ULTRACET) 37.5-325 MG tablet [Pharmacy Med Name: TRAMADOL/APAP 37.5MG /325MG  TABS] 180 tablet     Sig: TAKE 1 TO 2 TABLETS BY MOUTH EVERY 6 HOURS AS NEEDED     Not Delegated - Analgesics:  Opioid Agonist Combinations - acetaminophen / tramadol Failed - 02/25/2022  1:52 PM      Failed - This refill cannot be delegated      Failed - Urine Drug Screen completed in last 360 days      Failed - Valid encounter within last 3 months    Recent Outpatient Visits           4 months ago Annual physical exam   [place] [facility] at [facility], Jesse Sans, MD   7 months ago Essential hypertension   [place] and [facility] Medicine at [facility], Jesse Sans, MD  7 months ago Essential hypertension   [place] [facility] and [facility] Medicine at [facility], Nyoka Cowden, MD   10 months ago Essential hypertension   [place] [facility] and Sports Medicine at [facility], Nyoka Cowden, MD   11 months ago Greater trochanteric bursitis of left hip   [place] [facility] and [facility] Medicine at [facility], Ocie Bob, MD       Future Appointments             In 1 month Judithann Graves Nyoka Cowden, MD [facility] Primary Care and Sports Medicine at [facility], [facility]            Passed - Cr in normal range and within 360 days    Creatinine, Ser  Date Value Ref Range Status  10/09/2021 0.86 0.57 - 1.00 mg/dL Final         Passed - AST in normal range and within 360 days    AST  Date Value Ref Range Status  10/09/2021 12 0 - 40 IU/L Final         Passed - ALT in normal range and within 360 days    ALT  Date Value Ref Range Status  10/09/2021 11 0 - 32 IU/L Final

## 2022-03-01 ENCOUNTER — Encounter: Payer: Self-pay | Admitting: Internal Medicine

## 2022-03-01 DIAGNOSIS — I10 Essential (primary) hypertension: Secondary | ICD-10-CM | POA: Diagnosis not present

## 2022-03-01 DIAGNOSIS — R109 Unspecified abdominal pain: Secondary | ICD-10-CM | POA: Diagnosis not present

## 2022-03-01 DIAGNOSIS — K439 Ventral hernia without obstruction or gangrene: Secondary | ICD-10-CM | POA: Diagnosis not present

## 2022-03-01 DIAGNOSIS — K56609 Unspecified intestinal obstruction, unspecified as to partial versus complete obstruction: Secondary | ICD-10-CM | POA: Diagnosis not present

## 2022-03-01 DIAGNOSIS — K436 Other and unspecified ventral hernia with obstruction, without gangrene: Secondary | ICD-10-CM | POA: Diagnosis not present

## 2022-03-01 DIAGNOSIS — R1084 Generalized abdominal pain: Secondary | ICD-10-CM | POA: Diagnosis not present

## 2022-03-01 DIAGNOSIS — R1110 Vomiting, unspecified: Secondary | ICD-10-CM | POA: Diagnosis not present

## 2022-03-04 ENCOUNTER — Ambulatory Visit: Payer: BC Managed Care – PPO | Admitting: Internal Medicine

## 2022-03-04 DIAGNOSIS — K436 Other and unspecified ventral hernia with obstruction, without gangrene: Secondary | ICD-10-CM | POA: Insufficient documentation

## 2022-03-04 NOTE — Progress Notes (Deleted)
Date:  03/04/2022   Name:  April Fernandez   DOB:  1970/11/04   MRN:  [id number]   Chief Complaint: No chief complaint on file.  Abdominal Pain This is a new problem. Episode onset: last week. The pain is located in the periumbilical region. The pain is moderate.  She was seen in ED at [facility] and had a CT scan. Per the note: Patient's ventral hernia was reduced with gentle pressure, there was a pop that was palpated she felt instant relief. Repeat examination approximately 30 minutes later revealed that the patient's pain was nearly 100% improved. She has no mass upon repeat palpation. Will discuss with general surgery. General surgery okay with patient going home as long as they can tolerate p.o. we will plan for outpatient management. Additional lactic acid is normal to suggest again strangulation.  03/01/22: IMPRESSION:  Ventral abdominal wall hernia containing a single bowel loop resulting in small bowel obstruction.  Negative for pneumoperitoneum. Trace free fluid in the pelvis  Lab Results  Component Value Date   NA 140 10/09/2021   K 4.2 10/09/2021   CO2 21 10/09/2021   GLUCOSE 83 10/09/2021   BUN 10 10/09/2021   CREATININE 0.86 10/09/2021   CALCIUM 9.6 10/09/2021   EGFR 81 10/09/2021   GFRNONAA 73 05/27/2019   Lab Results  Component Value Date   CHOL 210 (H) 10/09/2021   HDL 49 10/09/2021   LDLCALC 150 (H) 10/09/2021   TRIG 63 10/09/2021   CHOLHDL 4.3 10/09/2021   Lab Results  Component Value Date   TSH 0.900 10/09/2021   Lab Results  Component Value Date   HGBA1C 5.7 (H) 10/09/2021   Lab Results  Component Value Date   WBC 4.9 10/09/2021   HGB 12.3 10/09/2021   HCT 37.7 10/09/2021   MCV 90 10/09/2021   PLT 309 10/09/2021   Lab Results  Component Value Date   ALT 11 10/09/2021   AST 12 10/09/2021   ALKPHOS 53 10/09/2021   BILITOT 0.5 10/09/2021   No results found for: "25OHVITD2", "25OHVITD3", "VD25OH"   Review of Systems  Gastrointestinal:   Positive for abdominal pain.    Patient Active Problem List   Diagnosis Date Noted   LGSIL on Pap smear of cervix 10/08/2021   Chronic SI joint pain 11/03/2020   Greater trochanteric bursitis of left hip 11/03/2020   Osteoarthritis of spine with radiculopathy, lumbar region 11/03/2020   Mixed hyperlipidemia 05/28/2019   Displacement of lumbar intervertebral disc without myelopathy 05/27/2019   Enthesopathy of hip region 05/27/2019   Fibromyositis 05/27/2019   Trigger finger of both hands 05/05/2015   Essential hypertension 01/09/2015   Degenerative disc disease, lumbar 01/09/2015   Allergic rhinitis 01/09/2015   Arthritis of hand 01/09/2015   Tobacco use disorder 01/09/2015   Arthralgia of hip or thigh 08/18/2009   Awareness of heartbeats 08/18/2009    Allergies  Allergen Reactions   Other Hives    Tree Nuts   Shellfish Allergy Hives   Hydrochlorothiazide Palpitations    Past Surgical History:  Procedure Laterality Date   lumbar ESI  2007    Social History   Tobacco Use   Smoking status: Every Day    Packs/day: 0.50    Years: 34.00    Total pack years: 17.00    Types: Cigarettes   Smokeless tobacco: Never  Vaping Use   Vaping Use: Former   Quit date: 05/27/2016  Substance Use Topics   Alcohol use: Not  Currently   Drug use: Never     Medication list has been reviewed and updated.  No outpatient medications have been marked as taking for the 03/04/22 encounter (Appointment) with Glean Hess, MD.       10/09/2021   10:09 AM 07/31/2021    3:44 PM 07/04/2021   10:25 AM 04/24/2021   11:05 AM  GAD 7 : Generalized Anxiety Score  Nervous, Anxious, on Edge 0 0 0 0  Control/stop worrying 0 0 0 0  Worry too much - different things 0 0 0 0  Trouble relaxing 0 0 0 0  Restless 0 0 0 0  Easily annoyed or irritable 0 0 0 0  Afraid - awful might happen 0 0 0 0  Total GAD 7 Score 0 0 0 0  Anxiety Difficulty Not difficult at all Not difficult at all Not difficult  at all        10/09/2021   10:09 AM 07/31/2021    3:44 PM 07/04/2021   10:25 AM  Depression screen PHQ 2/9  Decreased Interest 0 0 0  Down, Depressed, Hopeless 0 0 0  PHQ - 2 Score 0 0 0  Altered sleeping 2 0 1  Tired, decreased energy '2 2 1  ' Change in appetite 0 1 3  Feeling bad or failure about yourself  0 0 0  Trouble concentrating 0 0 0  Moving slowly or fidgety/restless 0 0 0  Suicidal thoughts 0 0 0  PHQ-9 Score '4 3 5  ' Difficult doing work/chores Not difficult at all Not difficult at all Somewhat difficult    BP Readings from Last 3 Encounters:  10/09/21 124/64  07/31/21 (!) 142/86  07/04/21 132/78    Physical Exam  Wt Readings from Last 3 Encounters:  10/09/21 143 lb (64.9 kg)  07/31/21 141 lb 9.6 oz (64.2 kg)  07/04/21 144 lb 6.4 oz (65.5 kg)    There were no vitals taken for this visit.  Assessment and Plan:

## 2022-03-07 ENCOUNTER — Encounter: Payer: Self-pay | Admitting: Internal Medicine

## 2022-03-28 ENCOUNTER — Other Ambulatory Visit: Payer: Self-pay | Admitting: Internal Medicine

## 2022-03-28 ENCOUNTER — Encounter: Payer: Self-pay | Admitting: Internal Medicine

## 2022-03-28 DIAGNOSIS — M5136 Other intervertebral disc degeneration, lumbar region: Secondary | ICD-10-CM

## 2022-03-28 NOTE — Telephone Encounter (Signed)
Please review. Last office visit 10/09/2021.  KP

## 2022-03-28 NOTE — Telephone Encounter (Signed)
Requested medication (s) are due for refill today:   Provider to review  Requested medication (s) are on the active medication list:   Yes  Future visit scheduled:   Yes   Last ordered: 02/26/2022 #180, 0 refills  Non delegated refill reason returned   Requested Prescriptions  Pending Prescriptions Disp Refills   traMADol-acetaminophen (ULTRACET) 37.5-325 MG tablet [Pharmacy Med Name: TRAMADOL/APAP 37.5MG /325MG  TABS] 180 tablet     Sig: TAKE 1 TO 2 TABLETS BY MOUTH EVERY 6 HOURS AS NEEDED     Not Delegated - Analgesics:  Opioid Agonist Combinations - acetaminophen / tramadol Failed - 03/28/2022  2:44 PM      Failed - This refill cannot be delegated      Failed - Urine Drug Screen completed in last 360 days      Failed - Valid encounter within last 3 months    Recent Outpatient Visits           5 months ago Annual physical exam   [place] [facility] at [facility], Jesse Sans, MD   8 months ago Essential hypertension   [place] and Sports Medicine at [facility], Jesse Sans, MD   8 months ago Essential hypertension   [place] [facility] at [facility], Jesse Sans, MD   11 months ago Essential hypertension   [place] and Sports Medicine at [facility], Jesse Sans, MD   1 year ago Greater trochanteric bursitis of left hip   [place] [facility] Medicine at [facility], Earley Abide, MD       Future Appointments             In 6 days Glean Hess, MD [facility] Primary Care and Sports Medicine at [facility], [place] in normal range and within 360 days    Creatinine, Ser  Date Value Ref Range Status  10/09/2021 0.86 0.57 - 1.00 mg/dL Final         Passed - AST in normal range and within 360 days    AST  Date Value Ref Range Status  10/09/2021 12 0 - 40 IU/L Final         Passed - ALT in  normal range and within 360 days    ALT  Date Value Ref Range Status  10/09/2021 11 0 - 32 IU/L Final

## 2022-04-01 ENCOUNTER — Encounter: Payer: Self-pay | Admitting: Internal Medicine

## 2022-04-03 ENCOUNTER — Ambulatory Visit: Payer: BC Managed Care – PPO | Admitting: Internal Medicine

## 2022-04-03 DIAGNOSIS — H1089 Other conjunctivitis: Secondary | ICD-10-CM | POA: Diagnosis not present

## 2022-04-25 ENCOUNTER — Encounter: Payer: Self-pay | Admitting: Internal Medicine

## 2022-04-25 ENCOUNTER — Ambulatory Visit: Payer: BC Managed Care – PPO | Admitting: Internal Medicine

## 2022-04-25 VITALS — BP 122/70 | HR 74 | Ht 62.0 in | Wt 143.0 lb

## 2022-04-25 DIAGNOSIS — Z1231 Encounter for screening mammogram for malignant neoplasm of breast: Secondary | ICD-10-CM

## 2022-04-25 DIAGNOSIS — M4726 Other spondylosis with radiculopathy, lumbar region: Secondary | ICD-10-CM

## 2022-04-25 DIAGNOSIS — K436 Other and unspecified ventral hernia with obstruction, without gangrene: Secondary | ICD-10-CM | POA: Diagnosis not present

## 2022-04-25 DIAGNOSIS — I10 Essential (primary) hypertension: Secondary | ICD-10-CM | POA: Diagnosis not present

## 2022-04-25 MED ORDER — GABAPENTIN 100 MG PO CAPS: 100.0000 mg | ORAL_CAPSULE | Freq: Every day | ORAL | 0 refills | Status: DC

## 2022-04-25 MED ORDER — MELOXICAM 15 MG PO TABS: 15.0000 mg | ORAL_TABLET | Freq: Every day | ORAL | 1 refills | Status: AC

## 2022-04-25 MED ORDER — AMLODIPINE BESYLATE 10 MG PO TABS: 10.0000 mg | ORAL_TABLET | Freq: Every day | ORAL | 1 refills | Status: DC

## 2022-04-25 MED ORDER — OLMESARTAN MEDOXOMIL 40 MG PO TABS: ORAL_TABLET | ORAL | 1 refills | Status: DC

## 2022-04-25 NOTE — Patient Instructions (Signed)
Call [facility] to schedule your Mammogram at [phone].  

## 2022-04-25 NOTE — Progress Notes (Signed)
Date:  04/25/2022   Name:  April Fernandez   DOB:  09/08/1970   MRN:  [id number]   Chief Complaint: Leg Pain and Hypertension  Leg Pain  There was no injury mechanism. The pain is present in the left leg and left hip. The quality of the pain is described as aching and burning. The pain is at a severity of 5/10. The pain is mild. The pain has been Constant since onset. Associated symptoms include muscle weakness. She reports no foreign bodies present. The symptoms are aggravated by weight bearing and movement (cold, rainy weather). She has tried ice and heat (injection, PT, water therapy) for the symptoms. The treatment provided mild relief.  Hypertension This is a chronic problem. The problem is controlled. Pertinent negatives include no chest pain, headaches, palpitations or shortness of breath. Past treatments include angiotensin blockers and calcium channel blockers. The current treatment provides significant improvement. There is no history of kidney disease, CAD/MI or CVA.  Abdominal Pain This is a recurrent problem. The pain is located in the epigastric region and LUQ. The pain is moderate. The quality of the pain is colicky and cramping. Associated symptoms include arthralgias and myalgias. Pertinent negatives include no fever or headaches. Prior diagnostic workup includes CT scan (showed a ventral hernia).    Lab Results  Component Value Date   NA 140 10/09/2021   K 4.2 10/09/2021   CO2 21 10/09/2021   GLUCOSE 83 10/09/2021   BUN 10 10/09/2021   CREATININE 0.86 10/09/2021   CALCIUM 9.6 10/09/2021   EGFR 81 10/09/2021   GFRNONAA 73 05/27/2019   Lab Results  Component Value Date   CHOL 210 (H) 10/09/2021   HDL 49 10/09/2021   LDLCALC 150 (H) 10/09/2021   TRIG 63 10/09/2021   CHOLHDL 4.3 10/09/2021   Lab Results  Component Value Date   TSH 0.900 10/09/2021   Lab Results  Component Value Date   HGBA1C 5.7 (H) 10/09/2021   Lab Results  Component Value Date   WBC  4.9 10/09/2021   HGB 12.3 10/09/2021   HCT 37.7 10/09/2021   MCV 90 10/09/2021   PLT 309 10/09/2021   Lab Results  Component Value Date   ALT 11 10/09/2021   AST 12 10/09/2021   ALKPHOS 53 10/09/2021   BILITOT 0.5 10/09/2021   No results found for: "25OHVITD2", "25OHVITD3", "VD25OH"   Review of Systems  Constitutional:  Negative for chills, fatigue and fever.  Respiratory:  Negative for chest tightness and shortness of breath.   Cardiovascular:  Negative for chest pain, palpitations and leg swelling.  Gastrointestinal:  Positive for abdominal pain.  Musculoskeletal:  Positive for arthralgias, back pain and myalgias.  Neurological:  Negative for dizziness and headaches.  Psychiatric/Behavioral:  Negative for dysphoric mood and sleep disturbance. The patient is not nervous/anxious.     Patient Active Problem List   Diagnosis Date Noted   Ventral hernia with bowel obstruction 03/04/2022   LGSIL on Pap smear of cervix 10/08/2021   Chronic SI joint pain 11/03/2020   Greater trochanteric bursitis of left hip 11/03/2020   Osteoarthritis of spine with radiculopathy, lumbar region 11/03/2020   Mixed hyperlipidemia 05/28/2019   Displacement of lumbar intervertebral disc without myelopathy 05/27/2019   Enthesopathy of hip region 05/27/2019   Fibromyositis 05/27/2019   Trigger finger of both hands 05/05/2015   Essential hypertension 01/09/2015   Degenerative disc disease, lumbar 01/09/2015   Allergic rhinitis 01/09/2015   Arthritis of hand 01/09/2015  Tobacco use disorder 01/09/2015   Arthralgia of hip or thigh 08/18/2009   Awareness of heartbeats 08/18/2009    Allergies  Allergen Reactions   Fish-Derived Products Hives and Swelling   Other Hives    Tree Nuts   Shellfish Allergy Hives   Fish Allergy Other (See Comments)   Peanut-Containing Drug Products Other (See Comments)   Hydrochlorothiazide Palpitations    Past Surgical History:  Procedure Laterality Date   lumbar  ESI  2007    Social History   Tobacco Use   Smoking status: Every Day    Packs/day: 0.50    Years: 34.00    Total pack years: 17.00    Types: Cigarettes   Smokeless tobacco: Never  Vaping Use   Vaping Use: Former   Quit date: 05/27/2016  Substance Use Topics   Alcohol use: Not Currently   Drug use: Never     Medication list has been reviewed and updated.  Current Meds  Medication Sig   amLODipine (NORVASC) 10 MG tablet Take 1 tablet (10 mg total) by mouth daily.   cyclobenzaprine (FLEXERIL) 5 MG tablet Take 1 tablet (5 mg total) by mouth at bedtime as needed for muscle spasms.   levonorgestrel-ethinyl estradiol (VIENVA) 0.1-20 MG-MCG tablet Take 1 tablet by mouth daily.   meloxicam (MOBIC) 15 MG tablet Take 1 tablet (15 mg total) by mouth daily.   olmesartan (BENICAR) 40 MG tablet TAKE 1 TABLET(40 MG) BY MOUTH DAILY   traMADol-acetaminophen (ULTRACET) 37.5-325 MG tablet TAKE 1 TO 2 TABLETS BY MOUTH EVERY 6 HOURS AS NEEDED   [DISCONTINUED] diclofenac (VOLTAREN) 0.1 % ophthalmic solution 4 (four) times daily.       04/25/2022   11:06 AM 10/09/2021   10:09 AM 07/31/2021    3:44 PM 07/04/2021   10:25 AM  GAD 7 : Generalized Anxiety Score  Nervous, Anxious, on Edge 0 0 0 0  Control/stop worrying 0 0 0 0  Worry too much - different things 0 0 0 0  Trouble relaxing 0 0 0 0  Restless 0 0 0 0  Easily annoyed or irritable 0 0 0 0  Afraid - awful might happen 0 0 0 0  Total GAD 7 Score 0 0 0 0  Anxiety Difficulty Not difficult at all Not difficult at all Not difficult at all Not difficult at all       04/25/2022   11:06 AM 10/09/2021   10:09 AM 07/31/2021    3:44 PM  Depression screen PHQ 2/9  Decreased Interest 0 0 0  Down, Depressed, Hopeless 0 0 0  PHQ - 2 Score 0 0 0  Altered sleeping 0 2 0  Tired, decreased energy _0 Change in appetite 0 0 1  Feeling bad or failure about yourself  0 0 0  Trouble concentrating 0 0 0  Moving slowly or fidgety/restless 0 0 0   Suicidal thoughts 0 0 0  PHQ-9 Score _1 Difficult doing work/chores Not difficult at all Not difficult at all Not difficult at all    BP Readings from Last 3 Encounters:  04/25/22 122/70  10/09/21 124/64  07/31/21 (!) 142/86    Physical Exam Vitals and nursing note reviewed.  Constitutional:      General: She is not in acute distress.    Appearance: Normal appearance. She is well-developed.  HENT:     Head: Normocephalic and atraumatic.  Cardiovascular:     Rate and Rhythm: Normal rate and  regular rhythm.  Pulmonary:     Effort: Pulmonary effort is normal. No respiratory distress.     Breath sounds: No wheezing or rhonchi.  Abdominal:     General: Abdomen is flat. Bowel sounds are normal.     Palpations: Abdomen is soft.  Musculoskeletal:     Cervical back: Normal range of motion.     Lumbar back: Spasms and tenderness present. Positive left straight leg raise test. Negative right straight leg raise test.     Left hip: Bony tenderness present.     Right lower leg: No edema.     Left lower leg: No edema.  Lymphadenopathy:     Cervical: No cervical adenopathy.  Skin:    General: Skin is warm and dry.     Findings: No rash.  Neurological:     General: No focal deficit present.     Mental Status: She is alert and oriented to person, place, and time.     Sensory: Sensation is intact.     Motor: Motor function is intact.     Deep Tendon Reflexes:     Reflex Scores:      Patellar reflexes are 2+ on the right side and 2+ on the left side. Psychiatric:        Mood and Affect: Mood normal.        Behavior: Behavior normal.     Wt Readings from Last 3 Encounters:  04/25/22 143 lb (64.9 kg)  10/09/21 143 lb (64.9 kg)  07/31/21 141 lb 9.6 oz (64.2 kg)    BP 122/70   Pulse 74   Ht _0  (1.575 m)   Wt 143 lb (64.9 kg)   SpO2 97%   BMI 26.16 kg/m   Assessment and Plan: 1. Essential hypertension Clinically stable exam with well controlled BP. Tolerating  medications without side effects at this time. Pt to continue current regimen and low sodium diet; benefits of regular exercise as able discussed. - amLODipine (NORVASC) 10 MG tablet; Take 1 tablet (10 mg total) by mouth daily.  Dispense: 90 tablet; Refill: 1 - olmesartan (BENICAR) 40 MG tablet; TAKE 1 TABLET(40 MG) BY MOUTH DAILY  Dispense: 90 tablet; Refill: 1  2. Osteoarthritis of spine with radiculopathy, lumbar region S/p full non surgical intervention in the past by Ortho On Tramadol 6 per day with Mobic. Will try gabapentin 100 mg qhs and titrate up to 300 mg. FMLA papers completed - gabapentin (NEURONTIN) 100 MG capsule; Take 1-3 capsules (100-300 mg total) by mouth at bedtime.  Dispense: 90 capsule; Refill: 0 - meloxicam (MOBIC) 15 MG tablet; Take 1 tablet (15 mg total) by mouth daily.  Dispense: 90 tablet; Refill: 1  3. Ventral hernia with bowel obstruction Recently found on ER visit with CT scan She is scheduling with the General surgeon   4. Encounter for screening mammogram for breast cancer Has never had a mammogram No breast issues but fiance is insisting she get this done - she will call [facility]   Partially dictated using Dragon software. Any errors are unintentional.  Halina Maidens, MD [facility]  04/25/2022

## 2022-05-01 ENCOUNTER — Other Ambulatory Visit: Payer: Self-pay | Admitting: Internal Medicine

## 2022-05-01 DIAGNOSIS — M5136 Other intervertebral disc degeneration, lumbar region: Secondary | ICD-10-CM

## 2022-05-01 NOTE — Telephone Encounter (Signed)
Requested medication (s) are due for refill today: yes  Requested medication (s) are on the active medication list: yes  Last refill:  03/29/22  Future visit scheduled:yes  Notes to clinic:  Unable to refill per protocol, cannot delegate.      Requested Prescriptions  Pending Prescriptions Disp Refills   traMADol-acetaminophen (ULTRACET) 37.5-325 MG tablet [Pharmacy Med Name: TRAMADOL/APAP 37.5MG /325MG  TABS] 180 tablet     Sig: TAKE 1 TO 2 TABLETS BY MOUTH EVERY 6 HOURS AS NEEDED     Not Delegated - Analgesics:  Opioid Agonist Combinations - acetaminophen / tramadol Failed - 05/01/2022 10:16 AM      Failed - This refill cannot be delegated      Failed - Urine Drug Screen completed in last 360 days      Passed - Cr in normal range and within 360 days    Creatinine, Ser  Date Value Ref Range Status  10/09/2021 0.86 0.57 - 1.00 mg/dL Final         Passed - AST in normal range and within 360 days    AST  Date Value Ref Range Status  10/09/2021 12 0 - 40 IU/L Final         Passed - ALT in normal range and within 360 days    ALT  Date Value Ref Range Status  10/09/2021 11 0 - 32 IU/L Final         Passed - Valid encounter within last 3 months    Recent Outpatient Visits           6 days ago Essential hypertension   [place] [facility] at [place], Nyoka Cowden, MD   6 months ago Annual physical exam   [place] [facility] at [facility], Nyoka Cowden, MD   9 months ago Essential hypertension   [place] [facility] at [facility], Nyoka Cowden, MD   10 months ago Essential hypertension   [place] [facility] and [facility] Medicine at [facility], Nyoka Cowden, MD   1 year ago Essential hypertension    Primary Care and Sports Medicine at [facility], Nyoka Cowden, MD

## 2022-05-01 NOTE — Telephone Encounter (Signed)
Please review. Last office visit 04/25/2022.  KP

## 2022-05-03 ENCOUNTER — Encounter: Payer: Self-pay | Admitting: Internal Medicine

## 2022-05-08 ENCOUNTER — Encounter: Payer: Self-pay | Admitting: Internal Medicine

## 2022-05-09 ENCOUNTER — Encounter: Payer: Self-pay | Admitting: Internal Medicine

## 2022-05-09 NOTE — Telephone Encounter (Signed)
Pt response.  KP

## 2022-05-24 ENCOUNTER — Other Ambulatory Visit: Payer: Self-pay | Admitting: Internal Medicine

## 2022-05-24 ENCOUNTER — Encounter: Payer: Self-pay | Admitting: Internal Medicine

## 2022-05-24 DIAGNOSIS — M5136 Other intervertebral disc degeneration, lumbar region: Secondary | ICD-10-CM

## 2022-05-24 MED ORDER — TRAMADOL-ACETAMINOPHEN 37.5-325 MG PO TABS: 1.0000 | ORAL_TABLET | Freq: Four times a day (QID) | ORAL | 0 refills | Status: DC | PRN

## 2022-05-24 NOTE — Telephone Encounter (Signed)
Please review. Last office visit 04/25/2022.  KP

## 2022-05-28 ENCOUNTER — Other Ambulatory Visit: Payer: Self-pay | Admitting: Internal Medicine

## 2022-05-28 DIAGNOSIS — M4726 Other spondylosis with radiculopathy, lumbar region: Secondary | ICD-10-CM

## 2022-05-29 NOTE — Telephone Encounter (Signed)
Unable to refill per protocol, Rx request is too soon. Last refill 04/25/22 for 90 days. Will refuse.  Requested Prescriptions  Pending Prescriptions Disp Refills   gabapentin (NEURONTIN) 100 MG capsule [Pharmacy Med Name: GABAPENTIN 100MG  CAPSULES] 90 capsule 0    Sig: TAKE 1 TO 3 CAPSULES(100 TO 300 MG) BY MOUTH AT BEDTIME     Neurology: Anticonvulsants - gabapentin Passed - 05/28/2022  3:31 AM      Passed - Cr in normal range and within 360 days    Creatinine, Ser  Date Value Ref Range Status  10/09/2021 0.86 0.57 - 1.00 mg/dL Final         Passed - Completed PHQ-2 or PHQ-9 in the last 360 days      Passed - Valid encounter within last 12 months    Recent Outpatient Visits           1 month ago Essential hypertension   [place] [facility] at [facility], Jesse Sans, MD   7 months ago Annual physical exam   [facility] at [facility], Jesse Sans, MD   10 months ago Essential hypertension   [place] [facility] and [facility] Medicine at [facility], Jesse Sans, MD   10 months ago Essential hypertension   [place] [facility] and [facility] Medicine at [facility], Jesse Sans, MD   1 year ago Essential hypertension   [place] Primary Care and Sports Medicine at [facility], Jesse Sans, MD

## 2022-06-28 ENCOUNTER — Other Ambulatory Visit: Payer: Self-pay | Admitting: Internal Medicine

## 2022-06-28 ENCOUNTER — Encounter: Payer: Self-pay | Admitting: Internal Medicine

## 2022-06-28 DIAGNOSIS — M5136 Other intervertebral disc degeneration, lumbar region: Secondary | ICD-10-CM

## 2022-06-28 MED ORDER — TRAMADOL-ACETAMINOPHEN 37.5-325 MG PO TABS: 1.0000 | ORAL_TABLET | Freq: Four times a day (QID) | ORAL | 0 refills | Status: DC | PRN

## 2022-06-28 NOTE — Telephone Encounter (Signed)
Duplicate

## 2022-06-28 NOTE — Telephone Encounter (Signed)
Please review. Last office visit 04/25/2022.  KP

## 2022-06-28 NOTE — Telephone Encounter (Signed)
Requested medications are due for refill today.  Unsure  Requested medications are on the active medications list.  yes  Last refill. 05/29/2022 #180 0 rf  Future visit scheduled.   no  Notes to clinic.  Refill not delegated.    Requested Prescriptions  Pending Prescriptions Disp Refills   traMADol-acetaminophen (ULTRACET) 37.5-325 MG tablet [Pharmacy Med Name: TRAMADOL/APAP 37.5MG /325MG  TABS] 180 tablet     Sig: TAKE 1 TO 2 TABLETS BY MOUTH EVERY 6 HOURS AS NEEDED     Not Delegated - Analgesics:  Opioid Agonist Combinations - acetaminophen / tramadol Failed - 06/28/2022  2:21 PM      Failed - This refill cannot be delegated      Failed - Urine Drug Screen completed in last 360 days      Passed - Cr in normal range and within 360 days    Creatinine, Ser  Date Value Ref Range Status  10/09/2021 0.86 0.57 - 1.00 mg/dL Final         Passed - AST in normal range and within 360 days    AST  Date Value Ref Range Status  10/09/2021 12 0 - 40 IU/L Final         Passed - ALT in normal range and within 360 days    ALT  Date Value Ref Range Status  10/09/2021 11 0 - 32 IU/L Final         Passed - Valid encounter within last 3 months    Recent Outpatient Visits           2 months ago Essential hypertension   [place] at [facility], Jesse Sans, MD   8 months ago Annual physical exam   [facility] at [facility], Jesse Sans, MD   11 months ago Essential hypertension   [place] at [facility], Jesse Sans, MD   11 months ago Essential hypertension   [place] at [facility], Jesse Sans, MD   1 year ago Essential hypertension   [place] at [facility], Jesse Sans, MD

## 2022-07-26 ENCOUNTER — Other Ambulatory Visit: Payer: Self-pay | Admitting: Internal Medicine

## 2022-07-26 DIAGNOSIS — M5136 Other intervertebral disc degeneration, lumbar region: Secondary | ICD-10-CM

## 2022-07-26 NOTE — Telephone Encounter (Signed)
Requested medication (s) are due for refill today: Yes  Requested medication (s) are on the active medication list: Yes  Last refill:  06/28/22  Future visit scheduled: No  Notes to clinic:  See request.    Requested Prescriptions  Pending Prescriptions Disp Refills   traMADol-acetaminophen (ULTRACET) 37.5-325 MG tablet [Pharmacy Med Name: TRAMADOL/APAP 37.'5MG'$ /'325MG'$  TABS] 180 tablet     Sig: TAKE 1 TO 2 TABLETS BY MOUTH EVERY 6 HOURS AS NEEDED     Not Delegated - Analgesics:  Opioid Agonist Combinations - acetaminophen / tramadol Failed - 07/26/2022  5:35 AM      Failed - This refill cannot be delegated      Failed - Urine Drug Screen completed in last 360 days      Failed - Valid encounter within last 3 months    Recent Outpatient Visits           3 months ago Essential hypertension   [place] at [facility], Jesse Sans, MD   9 months ago Annual physical exam   [place] at [facility], Jesse Sans, MD   12 months ago Essential hypertension   [place] at [facility], Jesse Sans, MD   1 year ago Essential hypertension   [place] at [facility], Jesse Sans, MD   1 year ago Essential hypertension   [place] at [facility] Jesse Sans, MD              Passed - Cr in normal range and within 360 days    Creatinine, Ser  Date Value Ref Range Status  10/09/2021 0.86 0.57 - 1.00 mg/dL Final         Passed - AST in normal range and within 360 days    AST  Date Value Ref Range Status  10/09/2021 12 0 - 40 IU/L Final         Passed - ALT in normal range and within 360 days    ALT  Date Value Ref Range Status  10/09/2021 11 0 - 32 IU/L Final

## 2022-07-29 ENCOUNTER — Encounter: Payer: Self-pay | Admitting: Internal Medicine

## 2022-07-29 ENCOUNTER — Other Ambulatory Visit: Payer: Self-pay | Admitting: Internal Medicine

## 2022-08-28 ENCOUNTER — Encounter: Payer: Self-pay | Admitting: Internal Medicine

## 2022-09-27 ENCOUNTER — Other Ambulatory Visit: Payer: Self-pay | Admitting: Internal Medicine

## 2022-09-27 DIAGNOSIS — Z0000 Encounter for general adult medical examination without abnormal findings: Secondary | ICD-10-CM

## 2022-09-27 DIAGNOSIS — M5136 Other intervertebral disc degeneration, lumbar region: Secondary | ICD-10-CM

## 2022-10-22 ENCOUNTER — Other Ambulatory Visit: Payer: Self-pay | Admitting: Internal Medicine

## 2022-10-22 ENCOUNTER — Encounter: Payer: Self-pay | Admitting: Internal Medicine

## 2022-10-22 DIAGNOSIS — M5136 Other intervertebral disc degeneration, lumbar region: Secondary | ICD-10-CM

## 2022-10-22 DIAGNOSIS — Z0000 Encounter for general adult medical examination without abnormal findings: Secondary | ICD-10-CM

## 2022-10-22 DIAGNOSIS — Z3041 Encounter for surveillance of contraceptive pills: Secondary | ICD-10-CM

## 2022-10-22 MED ORDER — TRAMADOL-ACETAMINOPHEN 37.5-325 MG PO TABS
1.0000 | ORAL_TABLET | Freq: Four times a day (QID) | ORAL | 1 refills | Status: DC | PRN
Start: 2022-10-25 — End: 2022-12-20

## 2022-10-22 MED ORDER — LEVONORGESTREL-ETHINYL ESTRAD 0.1-20 MG-MCG PO TABS: 1.0000 | ORAL_TABLET | Freq: Every day | ORAL | 1 refills | Status: DC

## 2022-10-23 ENCOUNTER — Other Ambulatory Visit: Payer: Self-pay | Admitting: Internal Medicine

## 2022-10-23 DIAGNOSIS — Z3041 Encounter for surveillance of contraceptive pills: Secondary | ICD-10-CM

## 2022-12-11 ENCOUNTER — Other Ambulatory Visit (HOSPITAL_COMMUNITY)
Admission: RE | Admit: 2022-12-11 | Discharge: 2022-12-11 | Disposition: A | Discharge status: Home / Self Care | Payer: 59 | Source: Ambulatory Visit | Attending: Internal Medicine | Admitting: Internal Medicine

## 2022-12-11 ENCOUNTER — Encounter: Payer: Self-pay | Admitting: Internal Medicine

## 2022-12-11 ENCOUNTER — Ambulatory Visit (INDEPENDENT_AMBULATORY_CARE_PROVIDER_SITE_OTHER): Payer: 59 | Admitting: Internal Medicine

## 2022-12-11 VITALS — BP 128/66 | HR 82 | Ht 62.0 in | Wt 141.4 lb

## 2022-12-11 DIAGNOSIS — Z23 Encounter for immunization: Secondary | ICD-10-CM

## 2022-12-11 DIAGNOSIS — Z1231 Encounter for screening mammogram for malignant neoplasm of breast: Secondary | ICD-10-CM | POA: Diagnosis not present

## 2022-12-11 DIAGNOSIS — E782 Mixed hyperlipidemia: Secondary | ICD-10-CM

## 2022-12-11 DIAGNOSIS — F17200 Nicotine dependence, unspecified, uncomplicated: Secondary | ICD-10-CM

## 2022-12-11 DIAGNOSIS — R87610 Atypical squamous cells of undetermined significance on cytologic smear of cervix (ASC-US): Secondary | ICD-10-CM | POA: Insufficient documentation

## 2022-12-11 DIAGNOSIS — Z0000 Encounter for general adult medical examination without abnormal findings: Secondary | ICD-10-CM

## 2022-12-11 DIAGNOSIS — R7303 Prediabetes: Secondary | ICD-10-CM | POA: Diagnosis not present

## 2022-12-11 DIAGNOSIS — M5136 Other intervertebral disc degeneration, lumbar region: Secondary | ICD-10-CM | POA: Diagnosis not present

## 2022-12-11 DIAGNOSIS — I10 Essential (primary) hypertension: Secondary | ICD-10-CM | POA: Diagnosis not present

## 2022-12-11 NOTE — Assessment & Plan Note (Signed)
Low back pain chronic daily symptoms On Tramadol and gabapentin

## 2022-12-11 NOTE — Assessment & Plan Note (Signed)
Normal exam with stable BP on amlodipine and Benicar. No concerns or side effects to current medication. No change in regimen; continue low sodium diet.

## 2022-12-11 NOTE — Assessment & Plan Note (Signed)
Moderately elevated - declined statin therapy Will recheck and advise

## 2022-12-11 NOTE — Patient Instructions (Signed)
 Call [facility] to schedule your Mammogram at [phone].

## 2022-12-11 NOTE — Progress Notes (Signed)
Date:  12/11/2022   Name:  April Fernandez   DOB:  1970/09/10   MRN:  [id number]   Chief Complaint: Annual Exam April Fernandez is a 52 y.o. female who presents today for her Complete Annual Exam. She feels fairly well. She reports exercising - none. She reports she is sleeping well. Breast complaints - none. She recently married and will be changing her name to Jones.  Mammogram: none DEXA: none Pap smear: 09/2021 neg HPV with ASCUS Colonoscopy: 10/2020  Health Maintenance Due  Topic Date Due   Pneumococcal Vaccine 86-59 Years old (1 of 2 - PCV) Never done   MAMMOGRAM  Never done   COVID-19 Vaccine (4 - 2023-24 season) 01/25/2022    Immunization History  Administered Date(s) Administered   Hepatitis A, Adult 05/27/2008   Influenza,inj,Quad PF,6+ Mos 03/05/2018, 04/09/2021, 03/29/2022   Influenza-Unspecified 03/31/2019, 03/15/2020   Moderna Sars-Covid-2 Vaccination 09/18/2019, 10/16/2019   PFIZER Comirnaty(Gray Top)Covid-19 Tri-Sucrose Vaccine 07/04/2020   Tdap 12/08/2014   Zoster Recombinant(Shingrix) 08/06/2019, 04/17/2020    Hypertension This is a chronic problem. The problem is controlled. Pertinent negatives include no chest pain, headaches, palpitations or shortness of breath.  Hyperlipidemia This is a chronic problem. The problem is uncontrolled. Recent lipid tests were reviewed and are high. Pertinent negatives include no chest pain or shortness of breath. She is currently on no antihyperlipidemic treatment.    Lab Results  Component Value Date   NA 140 10/09/2021   K 4.2 10/09/2021   CO2 21 10/09/2021   GLUCOSE 83 10/09/2021   BUN 10 10/09/2021   CREATININE 0.86 10/09/2021   CALCIUM 9.6 10/09/2021   EGFR 81 10/09/2021   GFRNONAA 73 05/27/2019   Lab Results  Component Value Date   CHOL 210 (H) 10/09/2021   HDL 49 10/09/2021   LDLCALC 150 (H) 10/09/2021   TRIG 63 10/09/2021   CHOLHDL 4.3 10/09/2021   Lab Results  Component Value Date   TSH 0.900  10/09/2021   Lab Results  Component Value Date   HGBA1C 5.7 (H) 10/09/2021   Lab Results  Component Value Date   WBC 4.9 10/09/2021   HGB 12.3 10/09/2021   HCT 37.7 10/09/2021   MCV 90 10/09/2021   PLT 309 10/09/2021   Lab Results  Component Value Date   ALT 11 10/09/2021   AST 12 10/09/2021   ALKPHOS 53 10/09/2021   BILITOT 0.5 10/09/2021   No results found for: "25OHVITD2", "25OHVITD3", "VD25OH"   Review of Systems  Constitutional:  Negative for chills, fatigue and fever.  HENT:  Negative for congestion, hearing loss, tinnitus, trouble swallowing and voice change.   Eyes:  Negative for visual disturbance.  Respiratory:  Negative for cough, chest tightness, shortness of breath and wheezing.   Cardiovascular:  Negative for chest pain, palpitations and leg swelling.  Gastrointestinal:  Negative for abdominal pain, constipation, diarrhea and vomiting.  Endocrine: Negative for polydipsia and polyuria.  Genitourinary:  Negative for dysuria, frequency, genital sores, vaginal bleeding and vaginal discharge.  Musculoskeletal:  Negative for arthralgias, gait problem and joint swelling.  Skin:  Negative for color change and rash.  Neurological:  Negative for dizziness, tremors, light-headedness and headaches.  Hematological:  Negative for adenopathy. Does not bruise/bleed easily.  Psychiatric/Behavioral:  Negative for dysphoric mood and sleep disturbance. The patient is not nervous/anxious.     Patient Active Problem List   Diagnosis Date Noted   Ventral hernia with bowel obstruction 03/04/2022   LGSIL on Pap smear of  cervix 10/08/2021   Chronic SI joint pain 11/03/2020   Greater trochanteric bursitis of left hip 11/03/2020   Osteoarthritis of spine with radiculopathy, lumbar region 11/03/2020   Mixed hyperlipidemia 05/28/2019   Displacement of lumbar intervertebral disc without myelopathy 05/27/2019   Enthesopathy of hip region 05/27/2019   Fibromyositis 05/27/2019   Trigger  finger of both hands 05/05/2015   Essential hypertension 01/09/2015   Degenerative disc disease, lumbar 01/09/2015   Allergic rhinitis 01/09/2015   Arthritis of hand 01/09/2015   Tobacco use disorder 01/09/2015   Arthralgia of hip or thigh 08/18/2009   Awareness of heartbeats 08/18/2009    Allergies  Allergen Reactions   Fish-Derived Products Hives and Swelling   Other Hives    Tree Nuts   Shellfish Allergy Hives   Fish Allergy Other (See Comments)   Peanut-Containing Drug Products Other (See Comments)   Hydrochlorothiazide Palpitations    Past Surgical History:  Procedure Laterality Date   lumbar ESI  2007    Social History   Tobacco Use   Smoking status: Every Day    Current packs/day: 0.50    Average packs/day: 0.5 packs/day for 37.0 years (18.5 ttl pk-yrs)    Types: Cigarettes   Smokeless tobacco: Never  Vaping Use   Vaping status: Former   Quit date: 05/27/2016  Substance Use Topics   Alcohol use: Not Currently   Drug use: Never     Medication list has been reviewed and updated.  Current Meds  Medication Sig   amLODipine (NORVASC) 10 MG tablet Take 1 tablet (10 mg total) by mouth daily.   cyclobenzaprine (FLEXERIL) 5 MG tablet Take 1 tablet (5 mg total) by mouth at bedtime as needed for muscle spasms.   gabapentin (NEURONTIN) 100 MG capsule Take 1-3 capsules (100-300 mg total) by mouth at bedtime.   levonorgestrel-ethinyl estradiol (VIENVA) 0.1-20 MG-MCG tablet Take 1 tablet by mouth daily.   meloxicam (MOBIC) 15 MG tablet Take 1 tablet (15 mg total) by mouth daily.   olmesartan (BENICAR) 40 MG tablet TAKE 1 TABLET(40 MG) BY MOUTH DAILY   traMADol-acetaminophen (ULTRACET) 37.5-325 MG tablet Take 1-2 tablets by mouth every 6 (six) hours as needed.       12/11/2022    9:41 AM 04/25/2022   11:06 AM 10/09/2021   10:09 AM 07/31/2021    3:44 PM  GAD 7 : Generalized Anxiety Score  Nervous, Anxious, on Edge 0 0 0 0  Control/stop worrying 0 0 0 0  Worry too much  - different things 0 0 0 0  Trouble relaxing 0 0 0 0  Restless 0 0 0 0  Easily annoyed or irritable 0 0 0 0  Afraid - awful might happen 0 0 0 0  Total GAD 7 Score 0 0 0 0  Anxiety Difficulty Not difficult at all Not difficult at all Not difficult at all Not difficult at all       12/11/2022    9:41 AM 04/25/2022   11:06 AM 10/09/2021   10:09 AM  Depression screen PHQ 2/9  Decreased Interest 0 0 0  Down, Depressed, Hopeless 0 0 0  PHQ - 2 Score 0 0 0  Altered sleeping 0 0 2  Tired, decreased energy 3 2 2   Change in appetite 0 0 0  Feeling bad or failure about yourself  0 0 0  Trouble concentrating 0 0 0  Moving slowly or fidgety/restless 1 0 0  Suicidal thoughts 0 0 0  PHQ-9 Score  4 2 4   Difficult doing work/chores Not difficult at all Not difficult at all Not difficult at all    BP Readings from Last 3 Encounters:  12/11/22 128/66  04/25/22 122/70  10/09/21 124/64    Physical Exam Vitals and nursing note reviewed.  Constitutional:      General: She is not in acute distress.    Appearance: She is well-developed.  HENT:     Head: Normocephalic and atraumatic.     Right Ear: Tympanic membrane and ear canal normal.     Left Ear: Tympanic membrane and ear canal normal.     Nose:     Right Sinus: No maxillary sinus tenderness.     Left Sinus: No maxillary sinus tenderness.  Eyes:     General: No scleral icterus.       Right eye: No discharge.        Left eye: No discharge.     Conjunctiva/sclera: Conjunctivae normal.  Neck:     Thyroid: No thyromegaly.     Vascular: No carotid bruit.  Cardiovascular:     Rate and Rhythm: Normal rate and regular rhythm.     Pulses: Normal pulses.     Heart sounds: Normal heart sounds.  Pulmonary:     Effort: Pulmonary effort is normal. No respiratory distress.     Breath sounds: No wheezing.  Chest:  Breasts:    Right: No mass, nipple discharge, skin change or tenderness.     Left: No mass, nipple discharge, skin change or  tenderness.  Abdominal:     General: Bowel sounds are normal.     Palpations: Abdomen is soft.     Tenderness: There is no abdominal tenderness.  Genitourinary:    Labia:        Right: No rash, tenderness or lesion.        Left: No rash, tenderness or lesion.      Vagina: Normal.     Cervix: Normal.     Uterus: Normal.      Adnexa: Right adnexa normal and left adnexa normal.     Comments: Pap obtained Musculoskeletal:     Cervical back: Normal range of motion. No erythema.     Right lower leg: No edema.     Left lower leg: No edema.  Lymphadenopathy:     Cervical: No cervical adenopathy.  Skin:    General: Skin is warm and dry.     Capillary Refill: Capillary refill takes less than 2 seconds.     Findings: No rash.  Neurological:     General: No focal deficit present.     Mental Status: She is alert and oriented to person, place, and time.     Cranial Nerves: No cranial nerve deficit.     Sensory: No sensory deficit.     Deep Tendon Reflexes: Reflexes are normal and symmetric.  Psychiatric:        Attention and Perception: Attention normal.        Mood and Affect: Mood normal.     Wt Readings from Last 3 Encounters:  12/11/22 141 lb 6.4 oz (64.1 kg)  04/25/22 143 lb (64.9 kg)  10/09/21 143 lb (64.9 kg)    BP 128/66   Pulse 82   Ht 5\' 2"  (1.575 m)   Wt 141 lb 6.4 oz (64.1 kg)   SpO2 98%   BMI 25.86 kg/m   Assessment and Plan:  Problem List Items Addressed This Visit     Tobacco  use disorder (Chronic)    She continues to smoke. Does not yet qualify for LDCT screening      Mixed hyperlipidemia (Chronic)    Moderately elevated - declined statin therapy Will recheck and advise      Relevant Orders   Lipid panel   Essential hypertension (Chronic)    Normal exam with stable BP on amlodipine and Benicar. No concerns or side effects to current medication. No change in regimen; continue low sodium diet.       Relevant Orders   CBC with  Differential/Platelet   Comprehensive metabolic panel   TSH   Degenerative disc disease, lumbar (Chronic)    Low back pain chronic daily symptoms On Tramadol and gabapentin      Other Visit Diagnoses     Annual physical exam    -  Primary   Relevant Orders   CBC with Differential/Platelet   Comprehensive metabolic panel   Lipid panel   TSH   HIV Antibody (routine testing w rflx)   Encounter for screening mammogram for breast cancer       Patient encouraged to schedule at [facility]   Prediabetes       Relevant Orders   Comprehensive metabolic panel   Hemoglobin A1c   Need for vaccination for pneumococcus       declines vaccine today   ASCUS of cervix with negative high risk HPV       Relevant Orders   Cytology - PAP       Return in about 6 months (around 06/13/2023) for hypertension.    Reubin Milan, MD [facility] Primary Care and Sports Medicine [place]

## 2022-12-11 NOTE — Assessment & Plan Note (Addendum)
She continues to smoke. Does not yet qualify for LDCT screening

## 2022-12-12 LAB — HIV ANTIBODY (ROUTINE TESTING W REFLEX): HIV Screen 4th Generation wRfx: NONREACTIVE

## 2022-12-12 LAB — COMPREHENSIVE METABOLIC PANEL
ALT: 6 IU/L (ref 0–32)
AST: 14 IU/L (ref 0–40)
Albumin: 4.4 g/dL (ref 3.8–4.9)
Alkaline Phosphatase: 49 IU/L (ref 44–121)
BUN/Creatinine Ratio: 9 (ref 9–23)
BUN: 8 mg/dL (ref 6–24)
Bilirubin Total: 0.4 mg/dL (ref 0.0–1.2)
CO2: 21 mmol/L (ref 20–29)
Calcium: 9.6 mg/dL (ref 8.7–10.2)
Chloride: 103 mmol/L (ref 96–106)
Creatinine, Ser: 0.92 mg/dL (ref 0.57–1.00)
Globulin, Total: 2.1 g/dL (ref 1.5–4.5)
Glucose: 86 mg/dL (ref 70–99)
Potassium: 4.3 mmol/L (ref 3.5–5.2)
Sodium: 137 mmol/L (ref 134–144)
Total Protein: 6.5 g/dL (ref 6.0–8.5)
eGFR: 74 mL/min/{1.73_m2} (ref >59)

## 2022-12-12 LAB — TSH: TSH: 0.991 u[IU]/mL (ref 0.450–4.500)

## 2022-12-12 LAB — CBC WITH DIFFERENTIAL/PLATELET
Basophils Absolute: 0 10*3/uL (ref 0.0–0.2)
Basos: 0 %
EOS (ABSOLUTE): 0.1 10*3/uL (ref 0.0–0.4)
Eos: 1 %
Hematocrit: 34.9 % (ref 34.0–46.6)
Hemoglobin: 11.6 g/dL (ref 11.1–15.9)
Immature Grans (Abs): 0 10*3/uL (ref 0.0–0.1)
Immature Granulocytes: 0 %
Lymphocytes Absolute: 1.8 10*3/uL (ref 0.7–3.1)
Lymphs: 32 %
MCH: 29.8 pg (ref 26.6–33.0)
MCHC: 33.2 g/dL (ref 31.5–35.7)
MCV: 90 fL (ref 79–97)
Monocytes Absolute: 0.3 10*3/uL (ref 0.1–0.9)
Monocytes: 6 %
Neutrophils Absolute: 3.5 10*3/uL (ref 1.4–7.0)
Neutrophils: 61 %
Platelets: 309 10*3/uL (ref 150–450)
RBC: 3.89 x10E6/uL (ref 3.77–5.28)
RDW: 11.7 % (ref 11.7–15.4)
WBC: 5.7 10*3/uL (ref 3.4–10.8)

## 2022-12-12 LAB — LIPID PANEL
Chol/HDL Ratio: 4.7 ratio — ABNORMAL HIGH (ref 0.0–4.4)
Cholesterol, Total: 225 mg/dL — ABNORMAL HIGH (ref 100–199)
HDL: 48 mg/dL (ref >39)
LDL Chol Calc (NIH): 163 mg/dL — ABNORMAL HIGH (ref 0–99)
Triglycerides: 77 mg/dL (ref 0–149)
VLDL Cholesterol Cal: 14 mg/dL (ref 5–40)

## 2022-12-12 LAB — HEMOGLOBIN A1C
Est. average glucose Bld gHb Est-mCnc: 128 mg/dL
Hgb A1c MFr Bld: 6.1 % — ABNORMAL HIGH (ref 4.8–5.6)

## 2022-12-13 LAB — CYTOLOGY - PAP
Chlamydia: NEGATIVE
Comment: NEGATIVE
Comment: NEGATIVE
Comment: NORMAL
Diagnosis: UNDETERMINED — AB
High risk HPV: NEGATIVE
Neisseria Gonorrhea: NEGATIVE

## 2022-12-19 ENCOUNTER — Other Ambulatory Visit: Payer: Self-pay | Admitting: Internal Medicine

## 2022-12-19 ENCOUNTER — Encounter: Payer: Self-pay | Admitting: Internal Medicine

## 2022-12-19 DIAGNOSIS — M5136 Other intervertebral disc degeneration, lumbar region: Secondary | ICD-10-CM

## 2022-12-19 NOTE — Telephone Encounter (Signed)
Requested medication (s) are due for refill today -yes  Requested medication (s) are on the active medication list -yes  Future visit scheduled -yes  Last refill: 10/25/22 #180 1RF  Notes to clinic: non delegated Rx  Requested Prescriptions  Pending Prescriptions Disp Refills   traMADol-acetaminophen (ULTRACET) 37.5-325 MG tablet [Pharmacy Med Name: TRAMADOL/APAP 37.5MG /325MG  TABS] 180 tablet     Sig: TAKE 1 TO 2 TABLETS BY MOUTH EVERY 6 HOURS AS NEEDED     Not Delegated - Analgesics:  Opioid Agonist Combinations - acetaminophen / tramadol Failed - 12/19/2022 12:09 PM      Failed - This refill cannot be delegated      Failed - Urine Drug Screen completed in last 360 days      Passed - Cr in normal range and within 360 days    Creatinine, Ser  Date Value Ref Range Status  12/11/2022 0.92 0.57 - 1.00 mg/dL Final         Passed - AST in normal range and within 360 days    AST  Date Value Ref Range Status  12/11/2022 14 0 - 40 IU/L Final         Passed - ALT in normal range and within 360 days    ALT  Date Value Ref Range Status  12/11/2022 6 0 - 32 IU/L Final         Passed - Valid encounter within last 3 months    Recent Outpatient Visits           1 week ago Annual physical exam   [place] [facility] at [facility], Nyoka Cowden, MD   7 months ago Essential hypertension   [place] [facility] Care & Sports Medicine at [facility], Nyoka Cowden, MD   1 year ago Annual physical exam   [facility] at [facility], Nyoka Cowden, MD   1 year ago Essential hypertension   [place] [facility] Care & Sports Medicine at [facility], Nyoka Cowden, MD   1 year ago Essential hypertension   [place] [facility] Care & Sports Medicine at [facility], Nyoka Cowden, MD       Future Appointments             In 5 months Reubin Milan, MD [facility] Primary Care & Sports Medicine  at [facility], [facility]   In 12 months Reubin Milan, MD [facility] Primary Care & Sports Medicine at [facility], [facility]               Requested Prescriptions  Pending Prescriptions Disp Refills   traMADol-acetaminophen (ULTRACET) 37.5-325 MG tablet [Pharmacy Med Name: TRAMADOL/APAP 37.5MG /325MG  TABS] 180 tablet     Sig: TAKE 1 TO 2 TABLETS BY MOUTH EVERY 6 HOURS AS NEEDED     Not Delegated - Analgesics:  Opioid Agonist Combinations - acetaminophen / tramadol Failed - 12/19/2022 12:09 PM      Failed - This refill cannot be delegated      Failed - Urine Drug Screen completed in last 360 days      Passed - Cr in normal range and within 360 days    Creatinine, Ser  Date Value Ref Range Status  12/11/2022 0.92 0.57 - 1.00 mg/dL Final         Passed - AST in normal range and within 360 days    AST  Date Value Ref Range  Status  12/11/2022 14 0 - 40 IU/L Final         Passed - ALT in normal range and within 360 days    ALT  Date Value Ref Range Status  12/11/2022 6 0 - 32 IU/L Final         Passed - Valid encounter within last 3 months    Recent Outpatient Visits           1 week ago Annual physical exam   [place] [facility] Medicine at [facility], Nyoka Cowden, MD   7 months ago Essential hypertension   [place] Primary Care & Sports Medicine at [facility], Nyoka Cowden, MD   1 year ago Annual physical exam   [facility] Primary [facility] Sports Medicine at [facility], Nyoka Cowden, MD   1 year ago Essential hypertension   [place] [facility] Medicine at [facility], Nyoka Cowden, MD   1 year ago Essential hypertension   [place] Primary Care & Sports Medicine at [facility], Nyoka Cowden, MD       Future Appointments             In 5 months Judithann Graves, Nyoka Cowden, MD [facility] Primary Care & Sports Medicine at [facility], [facility]   In 12 months Judithann Graves, Nyoka Cowden, MD [facility]  Health Primary Care & Sports Medicine at [facility], [facility]

## 2022-12-19 NOTE — Telephone Encounter (Signed)
Please review.  KP

## 2023-01-11 IMAGING — CR DG LUMBAR SPINE COMPLETE 4+V
5 series · 5 of 5 positions shown · non-contrast
Comparison: None.

CLINICAL DATA: Sciatica.

EXAM:
LUMBAR SPINE - COMPLETE 4+ VIEW

[l-spine ap]
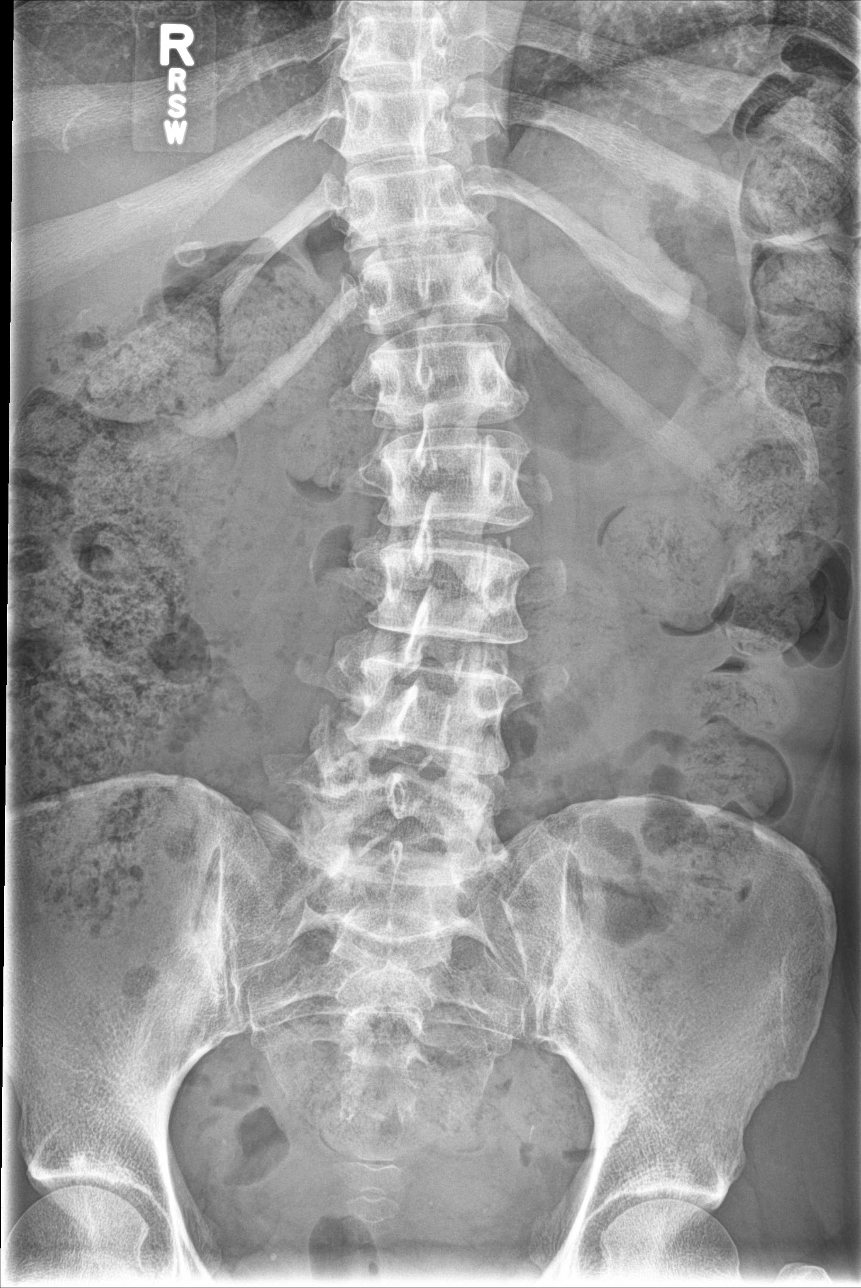

[l-spine obl (1 of 2)]
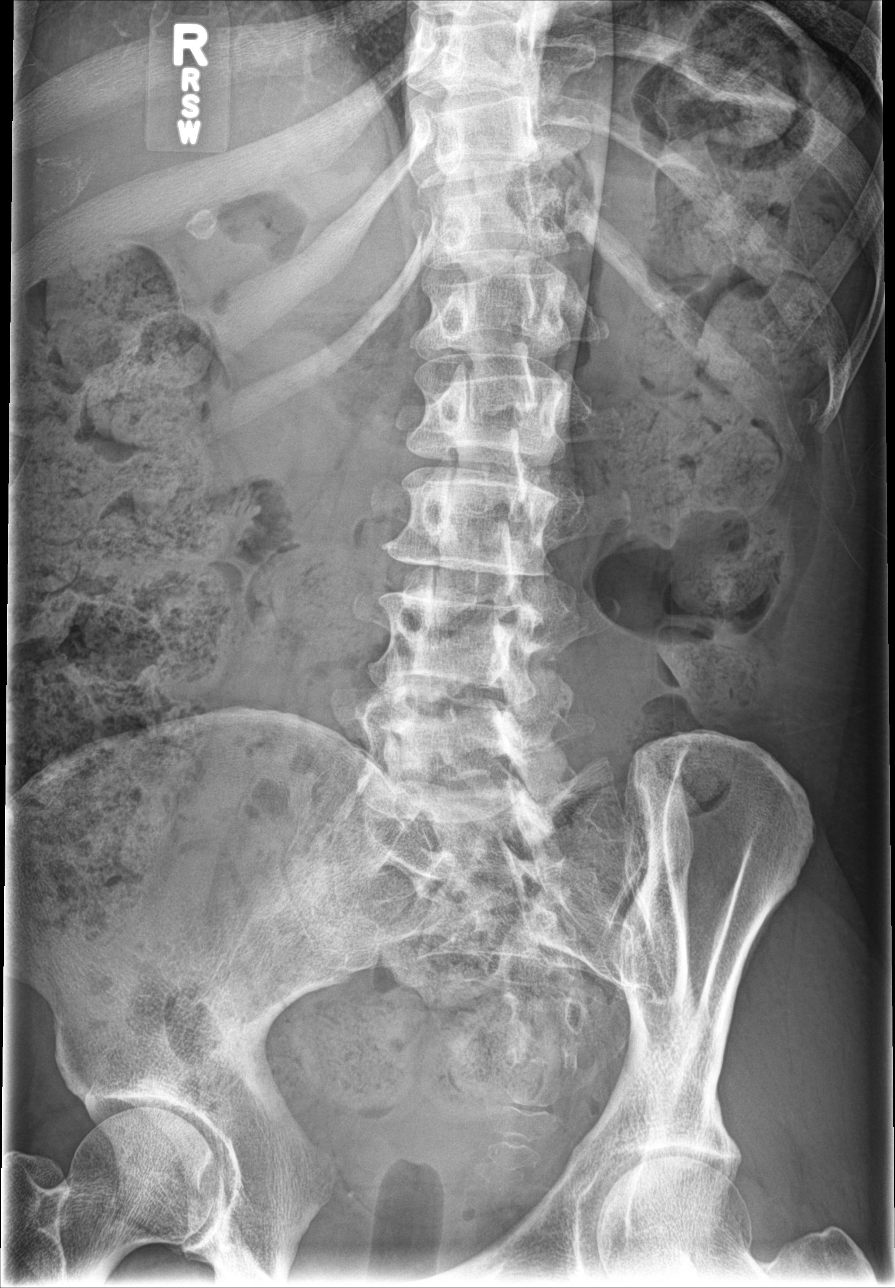

[l-spine obl (2 of 2)]
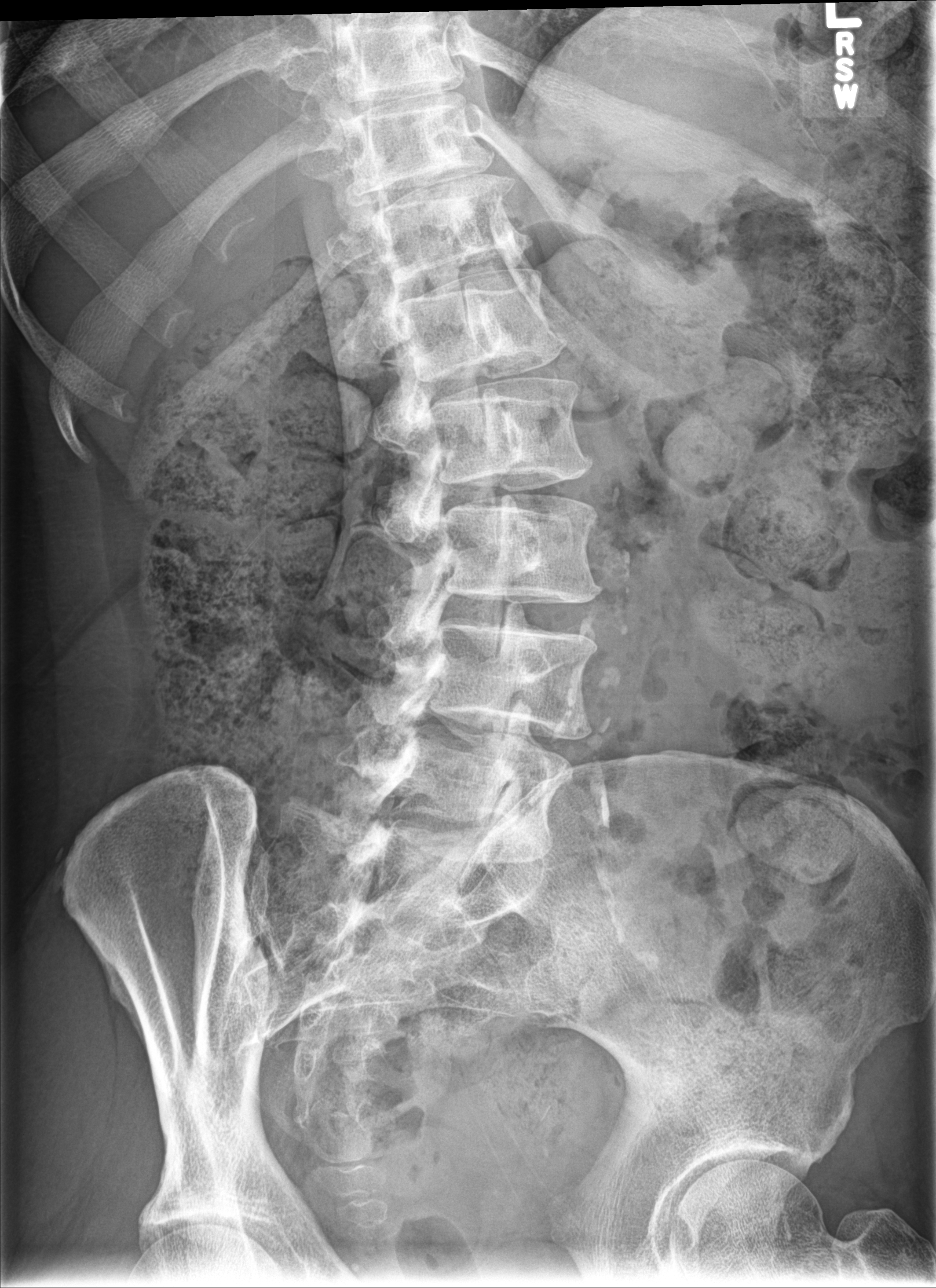

[l-spine lat]
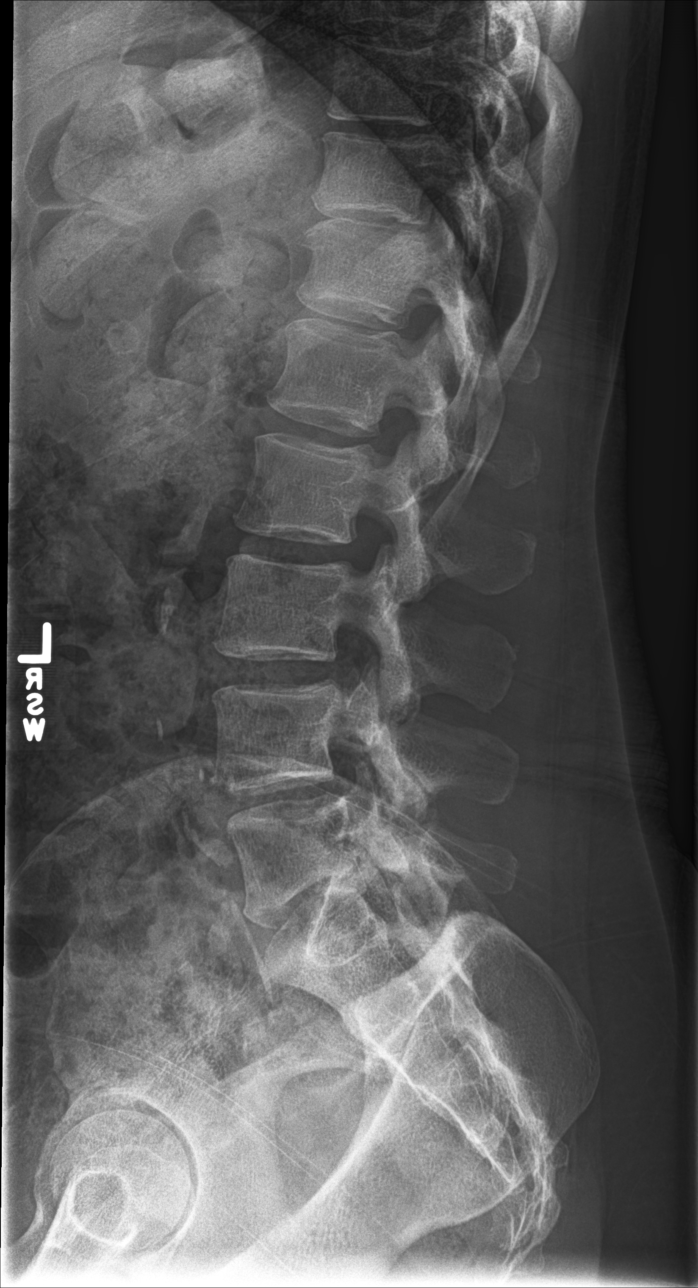

[l-spine spot]
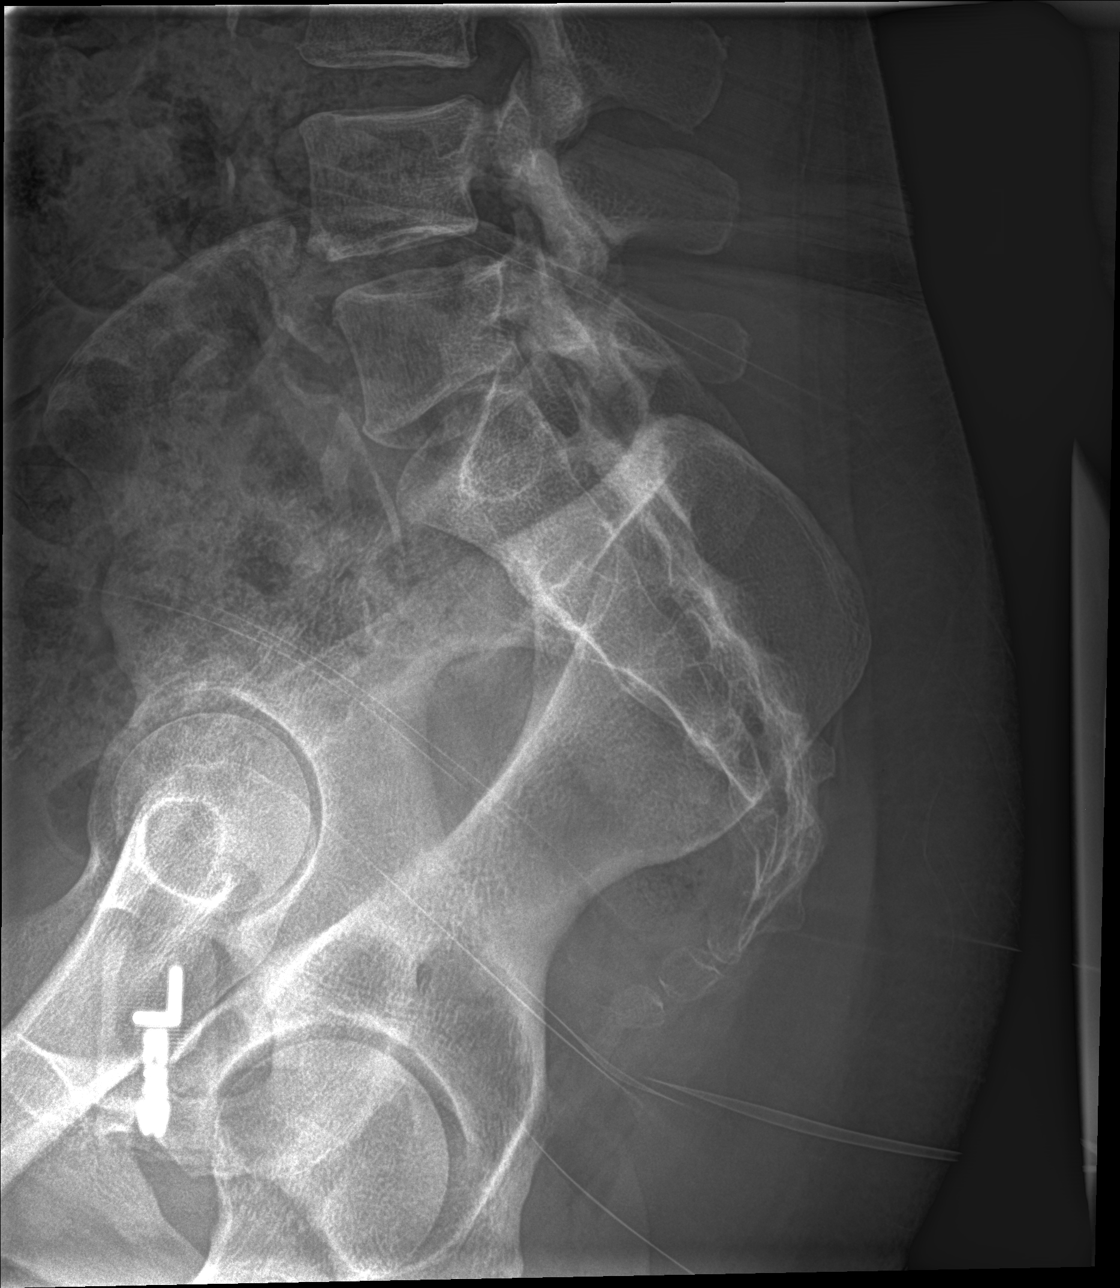

[5 of 5 positions shown; findings below may reference images not displayed]

FINDINGS: Severe fecal loading throughout the colon.

No fracture or traumatic malalignment. Mild degenerative disc
disease at a few levels with small anterior osteophytes. Probable
mild lower lumbar facet degenerative changes. Mild calcified
atherosclerosis in the abdominal aorta.
IMPRESSION: 1. Severe fecal loading throughout the colon.
2. Mild degenerative disc disease. Suggested mild lower lumbar facet
degenerative changes.
3. Mild calcified atherosclerosis in the abdominal aorta.

## 2023-01-17 ENCOUNTER — Other Ambulatory Visit: Payer: Self-pay | Admitting: Internal Medicine

## 2023-01-17 DIAGNOSIS — M5136 Other intervertebral disc degeneration, lumbar region: Secondary | ICD-10-CM

## 2023-02-12 ENCOUNTER — Encounter: Payer: Self-pay | Admitting: Internal Medicine

## 2023-02-18 ENCOUNTER — Other Ambulatory Visit: Payer: Self-pay | Admitting: Internal Medicine

## 2023-02-18 DIAGNOSIS — M5136 Other intervertebral disc degeneration, lumbar region: Secondary | ICD-10-CM

## 2023-04-17 ENCOUNTER — Other Ambulatory Visit: Payer: Self-pay | Admitting: Internal Medicine

## 2023-04-17 DIAGNOSIS — Z3041 Encounter for surveillance of contraceptive pills: Secondary | ICD-10-CM

## 2023-04-18 NOTE — Telephone Encounter (Signed)
Requested Prescriptions  Pending Prescriptions Disp Refills   VIENVA 0.1-20 MG-MCG tablet [Pharmacy Med Name: VIENVA 0.1MG /0.02MG  TABS 28S] 84 tablet 1    Sig: TAKE 1 TABLET BY MOUTH DAILY     OB/GYN:  Contraceptives Failed - 04/17/2023  3:27 AM      Failed - Patient is not a smoker      Passed - Last BP in normal range    BP Readings from Last 1 Encounters:  12/11/22 128/66         Passed - Valid encounter within last 12 months    Recent Outpatient Visits           4 months ago Annual physical exam   [place] [facility] Care [facility] Sports Medicine at [facility], Nyoka Cowden, MD   11 months ago Essential hypertension   [place] [facility] Care [facility] Sports Medicine at [facility], Nyoka Cowden, MD   1 year ago Annual physical exam   [facility] Care [facility] Medicine at [facility], Nyoka Cowden, MD   1 year ago Essential hypertension   [place] Primary Care & Sports Medicine at [facility], Nyoka Cowden, MD   1 year ago Essential hypertension   [place] [facility] Care & Sports Medicine at [facility], Nyoka Cowden, MD       Future Appointments             In 3 days Judithann Graves Nyoka Cowden, MD [facility] Primary Care & Sports Medicine at [facility], [facility]   In 1 month Judithann Graves, Nyoka Cowden, MD [facility] Primary Care & Sports Medicine at [facility], [facility]   In 8 months Judithann Graves, Nyoka Cowden, MD [facility] Primary Care & Sports Medicine at [facility], [facility]

## 2023-04-21 ENCOUNTER — Ambulatory Visit: Payer: 59 | Admitting: Internal Medicine

## 2023-04-21 ENCOUNTER — Encounter: Payer: Self-pay | Admitting: Internal Medicine

## 2023-04-21 VITALS — BP 124/74 | HR 107 | Ht 62.0 in | Wt 135.0 lb

## 2023-04-21 DIAGNOSIS — M720 Palmar fascial fibromatosis [Dupuytren]: Secondary | ICD-10-CM | POA: Insufficient documentation

## 2023-04-21 DIAGNOSIS — K047 Periapical abscess without sinus: Secondary | ICD-10-CM | POA: Diagnosis not present

## 2023-04-21 DIAGNOSIS — I10 Essential (primary) hypertension: Secondary | ICD-10-CM

## 2023-04-21 DIAGNOSIS — M5126 Other intervertebral disc displacement, lumbar region: Secondary | ICD-10-CM

## 2023-04-21 MED ORDER — AMOXICILLIN 875 MG PO TABS: 875.0000 mg | ORAL_TABLET | Freq: Two times a day (BID) | ORAL | 0 refills | Status: AC

## 2023-04-21 NOTE — Assessment & Plan Note (Signed)
Chronic low back pain on Tramadol. Needs FMLA updated.

## 2023-04-21 NOTE — Assessment & Plan Note (Addendum)
Controlled BP with normal exam. Current regimen is olmesartan and amlodipine. Will continue same medications; encourage continued reduced sodium diet.

## 2023-04-21 NOTE — Progress Notes (Signed)
Date:  04/21/2023   Name:  April Fernandez   DOB:  26-Jan-1971   MRN:  [id number]   Chief Complaint: FMLA  Back Pain This is a chronic problem. The problem occurs daily. The problem is unchanged. The pain is present in the lumbar spine. Pertinent negatives include no abdominal pain, bladder incontinence, bowel incontinence, chest pain, headaches or weakness.  Hypertension This is a chronic problem. The problem is controlled. Pertinent negatives include no chest pain, headaches, palpitations or shortness of breath. Past treatments include calcium channel blockers and angiotensin blockers. The current treatment provides significant improvement. There are no compliance problems.  There is no history of kidney disease, CAD/MI or CVA.  Dental Pain  This is a new problem. The current episode started in the past 7 days. The problem occurs constantly. The problem has been unchanged. The pain is mild (chipped a left lower tooth).  Hand Pain  There was no injury mechanism. The pain is present in the left hand. The quality of the pain is described as burning and cramping. The pain is moderate. The pain has been Worsening since the incident. Pertinent negatives include no chest pain.    Review of Systems  Constitutional:  Negative for fatigue and unexpected weight change.  HENT:  Negative for nosebleeds.   Eyes:  Negative for visual disturbance.  Respiratory:  Negative for cough, chest tightness, shortness of breath and wheezing.   Cardiovascular:  Negative for chest pain, palpitations and leg swelling.  Gastrointestinal:  Negative for abdominal pain, bowel incontinence, constipation and diarrhea.  Genitourinary:  Negative for bladder incontinence.  Musculoskeletal:  Positive for back pain.       And left hand pain   Neurological:  Negative for dizziness, weakness, light-headedness and headaches.  Psychiatric/Behavioral:  Negative for dysphoric mood and sleep disturbance. The patient is not  nervous/anxious.      Lab Results  Component Value Date   NA 137 12/11/2022   K 4.3 12/11/2022   CO2 21 12/11/2022   GLUCOSE 86 12/11/2022   BUN 8 12/11/2022   CREATININE 0.92 12/11/2022   CALCIUM 9.6 12/11/2022   EGFR 74 12/11/2022   GFRNONAA 73 05/27/2019   Lab Results  Component Value Date   CHOL 225 (H) 12/11/2022   HDL 48 12/11/2022   LDLCALC 163 (H) 12/11/2022   TRIG 77 12/11/2022   CHOLHDL 4.7 (H) 12/11/2022   Lab Results  Component Value Date   TSH 0.991 12/11/2022   Lab Results  Component Value Date   HGBA1C 6.1 (H) 12/11/2022   Lab Results  Component Value Date   WBC 5.7 12/11/2022   HGB 11.6 12/11/2022   HCT 34.9 12/11/2022   MCV 90 12/11/2022   PLT 309 12/11/2022   Lab Results  Component Value Date   ALT 6 12/11/2022   AST 14 12/11/2022   ALKPHOS 49 12/11/2022   BILITOT 0.4 12/11/2022   No results found for: "25OHVITD2", "25OHVITD3", "VD25OH"   Patient Active Problem List   Diagnosis Date Noted   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   Ventral hernia with bowel obstruction 03/04/2022   LGSIL on Pap smear of cervix 10/08/2021   Chronic SI joint pain 11/03/2020   Greater trochanteric bursitis of left hip 11/03/2020   Osteoarthritis of spine with radiculopathy, lumbar region 11/03/2020   Mixed hyperlipidemia 05/28/2019   Displacement of lumbar intervertebral disc without myelopathy 05/27/2019   Enthesopathy of hip region 05/27/2019   Fibromyositis 05/27/2019   Trigger finger of both hands  05/05/2015   Essential hypertension 01/09/2015   Degenerative disc disease, lumbar 01/09/2015   Allergic rhinitis 01/09/2015   Arthritis of hand 01/09/2015   Tobacco use disorder 01/09/2015   Arthralgia of hip or thigh 08/18/2009   Awareness of heartbeats 08/18/2009    Allergies  Allergen Reactions   Fish-Derived Products Hives and Swelling   Other Hives    Tree Nuts   Shellfish Allergy Hives   Fish Allergy Other (See Comments)   Peanut-Containing Drug  Products Other (See Comments)   Hydrochlorothiazide Palpitations    Past Surgical History:  Procedure Laterality Date   lumbar ESI  2007    Social History   Tobacco Use   Smoking status: Every Day    Current packs/day: 0.50    Average packs/day: 0.5 packs/day for 37.0 years (18.5 ttl pk-yrs)    Types: Cigarettes   Smokeless tobacco: Never  Vaping Use   Vaping status: Former   Quit date: 05/27/2016  Substance Use Topics   Alcohol use: Not Currently   Drug use: Never     Medication list has been reviewed and updated.  Current Meds  Medication Sig   amLODipine (NORVASC) 10 MG tablet Take 1 tablet (10 mg total) by mouth daily.   amoxicillin (AMOXIL) 875 MG tablet Take 1 tablet (875 mg total) by mouth 2 (two) times daily for 10 days.   cyclobenzaprine (FLEXERIL) 5 MG tablet Take 1 tablet (5 mg total) by mouth at bedtime as needed for muscle spasms.   gabapentin (NEURONTIN) 100 MG capsule Take 1-3 capsules (100-300 mg total) by mouth at bedtime.   meloxicam (MOBIC) 15 MG tablet Take 1 tablet (15 mg total) by mouth daily.   olmesartan (BENICAR) 40 MG tablet TAKE 1 TABLET(40 MG) BY MOUTH DAILY   traMADol-acetaminophen (ULTRACET) 37.5-325 MG tablet TAKE 1 TO 2 TABLETS BY MOUTH EVERY 6 HOURS AS NEEDED   VIENVA 0.1-20 MG-MCG tablet TAKE 1 TABLET BY MOUTH DAILY       12/11/2022    9:41 AM 04/25/2022   11:06 AM 10/09/2021   10:09 AM 07/31/2021    3:44 PM  GAD 7 : Generalized Anxiety Score  Nervous, Anxious, on Edge 0 0 0 0  Control/stop worrying 0 0 0 0  Worry too much - different things 0 0 0 0  Trouble relaxing 0 0 0 0  Restless 0 0 0 0  Easily annoyed or irritable 0 0 0 0  Afraid - awful might happen 0 0 0 0  Total GAD 7 Score 0 0 0 0  Anxiety Difficulty Not difficult at all Not difficult at all Not difficult at all Not difficult at all       12/11/2022    9:41 AM 04/25/2022   11:06 AM 10/09/2021   10:09 AM  Depression screen PHQ 2/9  Decreased Interest 0 0 0  Down,  Depressed, Hopeless 0 0 0  PHQ - 2 Score 0 0 0  Altered sleeping 0 0 2  Tired, decreased energy 3 2 2   Change in appetite 0 0 0  Feeling bad or failure about yourself  0 0 0  Trouble concentrating 0 0 0  Moving slowly or fidgety/restless 1 0 0  Suicidal thoughts 0 0 0  PHQ-9 Score 4 2 4   Difficult doing work/chores Not difficult at all Not difficult at all Not difficult at all    BP Readings from Last 3 Encounters:  04/21/23 124/74  12/11/22 128/66  04/25/22 122/70  Physical Exam Vitals and nursing note reviewed.  Constitutional:      General: She is not in acute distress.    Appearance: Normal appearance. She is well-developed.  HENT:     Head: Normocephalic and atraumatic.     Mouth/Throat:     Dentition: Dental tenderness present.   Cardiovascular:     Rate and Rhythm: Normal rate and regular rhythm.     Heart sounds: No murmur heard. Pulmonary:     Effort: Pulmonary effort is normal. No respiratory distress.     Breath sounds: No wheezing or rhonchi.  Musculoskeletal:     Right hand: Tenderness present. Decreased range of motion (base of left ring finger).     Right lower leg: No edema.     Left lower leg: No edema.  Skin:    General: Skin is warm and dry.     Findings: No rash.  Neurological:     Mental Status: She is alert and oriented to person, place, and time.  Psychiatric:        Mood and Affect: Mood normal.        Behavior: Behavior normal.     Wt Readings from Last 3 Encounters:  04/21/23 135 lb (61.2 kg)  12/11/22 141 lb 6.4 oz (64.1 kg)  04/25/22 143 lb (64.9 kg)    BP 124/74   Pulse (!) 107   Ht 5\' 2"  (1.575 m)   Wt 135 lb (61.2 kg)   SpO2 98%   BMI 24.69 kg/m   Assessment and Plan:  Problem List Items Addressed This Visit       Unprioritized   Displacement of lumbar intervertebral disc without myelopathy (Chronic)    Chronic low back pain on Tramadol. Needs FMLA updated.      Essential hypertension - Primary (Chronic)     Controlled BP with normal exam. Current regimen is olmesartan and amlodipine. Will continue same medications; encourage continued reduced sodium diet.       Palmar fibromatosis   Relevant Orders   AMB referral to orthopedics   Other Visit Diagnoses     Dental infection       Relevant Medications   amoxicillin (AMOXIL) 875 MG tablet       No follow-ups on file.    Reubin Milan, MD [facility] [place]

## 2023-05-03 ENCOUNTER — Other Ambulatory Visit: Payer: Self-pay | Admitting: Internal Medicine

## 2023-05-03 DIAGNOSIS — I10 Essential (primary) hypertension: Secondary | ICD-10-CM

## 2023-05-06 NOTE — Telephone Encounter (Signed)
Requested Prescriptions  Pending Prescriptions Disp Refills   olmesartan (BENICAR) 40 MG tablet [Pharmacy Med Name: OLMESARTAN MEDOXOMIL 40MG  TABLETS] 90 tablet 1    Sig: TAKE 1 TABLET(40 MG) BY MOUTH DAILY     Cardiovascular:  Angiotensin Receptor Blockers Passed - 05/03/2023 12:49 PM      Passed - Cr in normal range and within 180 days    Creatinine, Ser  Date Value Ref Range Status  12/11/2022 0.92 0.57 - 1.00 mg/dL Final         Passed - K in normal range and within 180 days    Potassium  Date Value Ref Range Status  12/11/2022 4.3 3.5 - 5.2 mmol/L Final         Passed - Patient is not pregnant      Passed - Last BP in normal range    BP Readings from Last 1 Encounters:  04/21/23 124/74         Passed - Valid encounter within last 6 months    Recent Outpatient Visits           2 weeks ago Essential hypertension   [place] [facility] at [facility], Nyoka Cowden, MD   4 months ago Annual physical exam   [facility] at [facility], Nyoka Cowden, MD   1 year ago Essential hypertension   [place] [facility] at [facility], Nyoka Cowden, MD   1 year ago Annual physical exam   [facility] at [facility], Nyoka Cowden, MD   1 year ago Essential hypertension   [place] [facility] at [facility], Nyoka Cowden, MD       Future Appointments             In 1 month Judithann Graves, Nyoka Cowden, MD [facility] at [facility], [facility]   In 7 months Judithann Graves, Nyoka Cowden, MD [facility] & [facility] Medicine at [facility], [facility]

## 2023-05-12 ENCOUNTER — Encounter: Payer: Self-pay | Admitting: Internal Medicine

## 2023-05-12 ENCOUNTER — Other Ambulatory Visit: Payer: Self-pay | Admitting: Internal Medicine

## 2023-05-12 DIAGNOSIS — M51369 Other intervertebral disc degeneration, lumbar region without mention of lumbar back pain or lower extremity pain: Secondary | ICD-10-CM

## 2023-05-12 MED ORDER — TRAMADOL-ACETAMINOPHEN 37.5-325 MG PO TABS: 1.0000 | ORAL_TABLET | Freq: Four times a day (QID) | ORAL | 2 refills | Status: DC | PRN

## 2023-05-12 NOTE — Telephone Encounter (Signed)
Please review. Refill not in the refill box.  KP

## 2023-05-14 ENCOUNTER — Other Ambulatory Visit: Payer: Self-pay | Admitting: Internal Medicine

## 2023-05-14 DIAGNOSIS — I10 Essential (primary) hypertension: Secondary | ICD-10-CM

## 2023-05-14 NOTE — Telephone Encounter (Signed)
Requested Prescriptions  Pending Prescriptions Disp Refills   amLODipine (NORVASC) 10 MG tablet [Pharmacy Med Name: AMLODIPINE BESYLATE 10MG  TABLETS] 90 tablet 1    Sig: TAKE 1 TABLET(10 MG) BY MOUTH DAILY     Cardiovascular: Calcium Channel Blockers 2 Passed - 05/14/2023 11:45 AM      Passed - Last BP in normal range    BP Readings from Last 1 Encounters:  04/21/23 124/74         Passed - Last Heart Rate in normal range    Pulse Readings from Last 1 Encounters:  04/21/23 (!) 107         Passed - Valid encounter within last 6 months    Recent Outpatient Visits           3 weeks ago Essential hypertension   [place] [facility] at [facility], Nyoka Cowden, MD   5 months ago Annual physical exam   [facility] & [facility] Medicine at [facility], Nyoka Cowden, MD   1 year ago Essential hypertension   [place] [facility] at [facility], Nyoka Cowden, MD   1 year ago Annual physical exam   [facility] at [facility], Nyoka Cowden, MD   1 year ago Essential hypertension   [place] [facility] Medicine at [facility], Nyoka Cowden, MD       Future Appointments             In 1 month Judithann Graves, Nyoka Cowden, MD [facility] Primary [facility] & Sports Medicine at [facility], [place], [facility]   In 7 months Judithann Graves, Nyoka Cowden, MD [facility] Primary Care & Sports Medicine at [facility], [facility]

## 2023-05-27 ENCOUNTER — Encounter: Payer: Self-pay | Admitting: Internal Medicine

## 2023-05-27 ENCOUNTER — Other Ambulatory Visit: Payer: Self-pay

## 2023-05-27 MED ORDER — FLUCONAZOLE 150 MG PO TABS: 150.0000 mg | ORAL_TABLET | Freq: Once | ORAL | 0 refills | Status: AC

## 2023-06-13 ENCOUNTER — Encounter: Payer: Self-pay | Admitting: Internal Medicine

## 2023-06-13 ENCOUNTER — Ambulatory Visit: Payer: 59 | Admitting: Internal Medicine

## 2023-06-13 VITALS — BP 118/70 | HR 87 | Ht 62.0 in | Wt 134.0 lb

## 2023-06-13 DIAGNOSIS — K8020 Calculus of gallbladder without cholecystitis without obstruction: Secondary | ICD-10-CM | POA: Diagnosis not present

## 2023-06-13 DIAGNOSIS — I10 Essential (primary) hypertension: Secondary | ICD-10-CM

## 2023-06-13 DIAGNOSIS — M5126 Other intervertebral disc displacement, lumbar region: Secondary | ICD-10-CM | POA: Diagnosis not present

## 2023-06-13 DIAGNOSIS — M51362 Other intervertebral disc degeneration, lumbar region with discogenic back pain and lower extremity pain: Secondary | ICD-10-CM | POA: Diagnosis not present

## 2023-06-13 MED ORDER — TRAMADOL-ACETAMINOPHEN 37.5-325 MG PO TABS: 1.0000 | ORAL_TABLET | Freq: Four times a day (QID) | ORAL | 0 refills | Status: DC | PRN

## 2023-06-13 NOTE — Assessment & Plan Note (Addendum)
Has been evaluated and treated by Ortho and pain management in the past. Completed PT, ESI with residual chronic daily symptoms Taking Ultracet 6-8 per day with good relief and ability to work and perform ADLs Recently reduced Rx to 180/mo but she has needed to take an additional dose intermittently due to weather changes Will increase to #200/mo

## 2023-06-13 NOTE — Assessment & Plan Note (Signed)
Controlled BP with normal exam. Current regimen is olmesartan and amlodipine.

## 2023-06-13 NOTE — Progress Notes (Signed)
Date:  06/13/2023   Name:  April Fernandez   DOB:  Mar 07, 1971   MRN:  [id number]   Chief Complaint: Hypertension  Hypertension This is a chronic problem. The problem is controlled. Pertinent negatives include no chest pain, headaches or shortness of breath. Past treatments include angiotensin blockers, diuretics and calcium channel blockers. The current treatment provides significant improvement.  Back Pain This is a chronic problem. The pain is present in the lumbar spine. Associated symptoms include leg pain and tingling. Pertinent negatives include no abdominal pain, bladder incontinence, bowel incontinence, chest pain, fever, headaches, numbness or weakness.    Review of Systems  Constitutional:  Negative for chills, fatigue and fever.  HENT:  Negative for trouble swallowing.   Respiratory:  Negative for chest tightness and shortness of breath.   Cardiovascular:  Negative for chest pain.  Gastrointestinal:  Negative for abdominal pain, bowel incontinence, diarrhea and nausea.  Genitourinary:  Negative for bladder incontinence.  Musculoskeletal:  Positive for back pain, gait problem and myalgias.  Neurological:  Positive for tingling. Negative for dizziness, weakness, numbness and headaches.  Psychiatric/Behavioral:  Negative for dysphoric mood and sleep disturbance. The patient is not nervous/anxious.      Lab Results  Component Value Date   NA 137 12/11/2022   K 4.3 12/11/2022   CO2 21 12/11/2022   GLUCOSE 86 12/11/2022   BUN 8 12/11/2022   CREATININE 0.92 12/11/2022   CALCIUM 9.6 12/11/2022   EGFR 74 12/11/2022   GFRNONAA 73 05/27/2019   Lab Results  Component Value Date   CHOL 225 (H) 12/11/2022   HDL 48 12/11/2022   LDLCALC 163 (H) 12/11/2022   TRIG 77 12/11/2022   CHOLHDL 4.7 (H) 12/11/2022   Lab Results  Component Value Date   TSH 0.991 12/11/2022   Lab Results  Component Value Date   HGBA1C 6.1 (H) 12/11/2022   Lab Results  Component Value Date    WBC 5.7 12/11/2022   HGB 11.6 12/11/2022   HCT 34.9 12/11/2022   MCV 90 12/11/2022   PLT 309 12/11/2022   Lab Results  Component Value Date   ALT 6 12/11/2022   AST 14 12/11/2022   ALKPHOS 49 12/11/2022   BILITOT 0.4 12/11/2022   No results found for: "25OHVITD2", "25OHVITD3", "VD25OH"   Patient Active Problem List   Diagnosis Date Noted   Calculus of gallbladder without cholecystitis without obstruction 06/13/2023   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   Ventral hernia with bowel obstruction 03/04/2022   LGSIL on Pap smear of cervix 10/08/2021   Chronic SI joint pain 11/03/2020   Greater trochanteric bursitis of left hip 11/03/2020   Osteoarthritis of spine with radiculopathy, lumbar region 11/03/2020   Mixed hyperlipidemia 05/28/2019   Displacement of lumbar intervertebral disc without myelopathy 05/27/2019   Enthesopathy of hip region 05/27/2019   Fibromyositis 05/27/2019   Trigger finger of both hands 05/05/2015   Essential hypertension 01/09/2015   Degeneration of intervertebral disc of lumbar region with discogenic back pain and lower extremity pain 01/09/2015   Allergic rhinitis 01/09/2015   Arthritis of hand 01/09/2015   Tobacco use disorder 01/09/2015   Arthralgia of hip or thigh 08/18/2009   Awareness of heartbeats 08/18/2009    Allergies  Allergen Reactions   Fish-Derived Products Hives and Swelling   Other Hives    Tree Nuts   Shellfish Allergy Hives   Fish Allergy Other (See Comments)   Peanut-Containing Drug Products Other (See Comments)   Hydrochlorothiazide Palpitations  Past Surgical History:  Procedure Laterality Date   lumbar ESI  2007    Social History   Tobacco Use   Smoking status: Every Day    Current packs/day: 0.50    Average packs/day: 0.5 packs/day for 37.0 years (18.5 ttl pk-yrs)    Types: Cigarettes   Smokeless tobacco: Never  Vaping Use   Vaping status: Former   Quit date: 05/27/2016  Substance Use Topics   Alcohol use: Not  Currently   Drug use: Never     Medication list has been reviewed and updated.  Current Meds  Medication Sig   amLODipine (NORVASC) 10 MG tablet TAKE 1 TABLET(10 MG) BY MOUTH DAILY   cyclobenzaprine (FLEXERIL) 5 MG tablet Take 1 tablet (5 mg total) by mouth at bedtime as needed for muscle spasms.   meloxicam (MOBIC) 15 MG tablet Take 1 tablet (15 mg total) by mouth daily.   olmesartan (BENICAR) 40 MG tablet TAKE 1 TABLET(40 MG) BY MOUTH DAILY   VIENVA 0.1-20 MG-MCG tablet TAKE 1 TABLET BY MOUTH DAILY   [DISCONTINUED] gabapentin (NEURONTIN) 100 MG capsule Take 1-3 capsules (100-300 mg total) by mouth at bedtime.   [DISCONTINUED] traMADol-acetaminophen (ULTRACET) 37.5-325 MG tablet Take 1-2 tablets by mouth every 6 (six) hours as needed.       06/13/2023    9:52 AM 12/11/2022    9:41 AM 04/25/2022   11:06 AM 10/09/2021   10:09 AM  GAD 7 : Generalized Anxiety Score  Nervous, Anxious, on Edge 0 0 0 0  Control/stop worrying 0 0 0 0  Worry too much - different things 0 0 0 0  Trouble relaxing 0 0 0 0  Restless 0 0 0 0  Easily annoyed or irritable 0 0 0 0  Afraid - awful might happen 0 0 0 0  Total GAD 7 Score 0 0 0 0  Anxiety Difficulty Not difficult at all Not difficult at all Not difficult at all Not difficult at all       06/13/2023    9:52 AM 12/11/2022    9:41 AM 04/25/2022   11:06 AM  Depression screen PHQ 2/9  Decreased Interest 0 0 0  Down, Depressed, Hopeless 0 0 0  PHQ - 2 Score 0 0 0  Altered sleeping 1 0 0  Tired, decreased energy 1 3 2   Change in appetite 1 0 0  Feeling bad or failure about yourself  0 0 0  Trouble concentrating 0 0 0  Moving slowly or fidgety/restless 0 1 0  Suicidal thoughts 0 0 0  PHQ-9 Score 3 4 2   Difficult doing work/chores Not difficult at all Not difficult at all Not difficult at all    BP Readings from Last 3 Encounters:  06/13/23 118/70  04/21/23 124/74  12/11/22 128/66    Physical Exam Vitals and nursing note reviewed.   Constitutional:      General: She is not in acute distress.    Appearance: Normal appearance. She is well-developed.  HENT:     Head: Normocephalic and atraumatic.  Cardiovascular:     Rate and Rhythm: Normal rate and regular rhythm.  Pulmonary:     Effort: Pulmonary effort is normal. No respiratory distress.  Musculoskeletal:     Cervical back: Normal range of motion.     Lumbar back: No spasms or bony tenderness. Decreased range of motion. Positive left straight leg raise test. Negative right straight leg raise test.     Right hip: Normal.  Left hip: Normal.  Skin:    General: Skin is warm and dry.     Findings: No rash.  Neurological:     Mental Status: She is alert and oriented to person, place, and time.     Sensory: Sensation is intact.     Motor: Motor function is intact.     Deep Tendon Reflexes:     Reflex Scores:      Patellar reflexes are 1+ on the right side and 1+ on the left side. Psychiatric:        Mood and Affect: Mood normal.        Behavior: Behavior normal.     Wt Readings from Last 3 Encounters:  06/13/23 134 lb (60.8 kg)  04/21/23 135 lb (61.2 kg)  12/11/22 141 lb 6.4 oz (64.1 kg)    BP 118/70   Pulse 87   Ht 5\' 2"  (1.575 m)   Wt 134 lb (60.8 kg)   SpO2 98%   BMI 24.51 kg/m   Assessment and Plan:  Problem List Items Addressed This Visit       Unprioritized   Essential hypertension - Primary (Chronic)   Controlled BP with normal exam. Current regimen is olmesartan and amlodipine.        Displacement of lumbar intervertebral disc without myelopathy (Chronic)   Chronic low back pain unchanged - some days are better than others She is not interested in going back to Ortho or pain management      Relevant Medications   traMADol-acetaminophen (ULTRACET) 37.5-325 MG tablet   Degeneration of intervertebral disc of lumbar region with discogenic back pain and lower extremity pain   Has been evaluated and treated by Ortho and pain  management in the past. Completed PT, ESI with residual chronic daily symptoms Taking Ultracet 6-8 per day with good relief and ability to work and perform ADLs Recently reduced Rx to 180/mo but she has needed to take an additional dose intermittently due to weather changes Will increase to #200/mo      Relevant Medications   traMADol-acetaminophen (ULTRACET) 37.5-325 MG tablet   Calculus of gallbladder without cholecystitis without obstruction   Seen in ED about three months ago. Did not require surgery.  Continues to have mild intermittent pain at times. Can continue to monitor and refer to Gen Surgery if worsening       No follow-ups on file.    Reubin Milan, MD [facility] [place]

## 2023-06-13 NOTE — Assessment & Plan Note (Signed)
Chronic low back pain unchanged - some days are better than others She is not interested in going back to Ortho or pain management

## 2023-06-13 NOTE — Assessment & Plan Note (Signed)
Seen in ED about three months ago. Did not require surgery.  Continues to have mild intermittent pain at times. Can continue to monitor and refer to Gen Surgery if worsening

## 2023-07-11 ENCOUNTER — Other Ambulatory Visit: Payer: Self-pay | Admitting: Internal Medicine

## 2023-07-11 ENCOUNTER — Encounter: Payer: Self-pay | Admitting: Internal Medicine

## 2023-07-11 DIAGNOSIS — M5126 Other intervertebral disc displacement, lumbar region: Secondary | ICD-10-CM

## 2023-07-11 NOTE — Telephone Encounter (Signed)
Requested medication (s) are due for refill today: Yes  Requested medication (s) are on the active medication list: Yes  Last refill:  06/13/23  Future visit scheduled: Yes  Notes to clinic:  Unable to refill per protocol, cannot delegate.      Requested Prescriptions  Pending Prescriptions Disp Refills   traMADol-acetaminophen (ULTRACET) 37.5-325 MG tablet [Pharmacy Med Name: TRAMADOL/APAP 37.5MG /325MG  TABS] 200 tablet     Sig: TAKE 1 TO 2 TABLETS BY MOUTH EVERY 6 HOURS AS NEEDED     Not Delegated - Analgesics:  Opioid Agonist Combinations - acetaminophen / tramadol Failed - 07/11/2023  2:01 PM      Failed - This refill cannot be delegated      Failed - Urine Drug Screen completed in last 360 days      Passed - Cr in normal range and within 360 days    Creatinine, Ser  Date Value Ref Range Status  12/11/2022 0.92 0.57 - 1.00 mg/dL Final         Passed - AST in normal range and within 360 days    AST  Date Value Ref Range Status  12/11/2022 14 0 - 40 IU/L Final         Passed - ALT in normal range and within 360 days    ALT  Date Value Ref Range Status  12/11/2022 6 0 - 32 IU/L Final         Passed - Valid encounter within last 3 months    Recent Outpatient Visits           4 weeks ago Essential hypertension   [place] [facility] at [facility], Nyoka Cowden, MD   2 months ago Essential hypertension   [place] [facility] at [facility], Nyoka Cowden, MD   7 months ago Annual physical exam   [facility] at [facility], Nyoka Cowden, MD   1 year ago Essential hypertension   [place] [facility] at [facility], Nyoka Cowden, MD   1 year ago Annual physical exam   [facility] Medicine at [facility], Nyoka Cowden, MD       Future Appointments             In 5 months Judithann Graves, Nyoka Cowden, MD [facility]  Health Primary Care & Sports Medicine at [facility], [facility]

## 2023-07-11 NOTE — Telephone Encounter (Signed)
Please review. JM

## 2023-08-12 ENCOUNTER — Other Ambulatory Visit: Payer: Self-pay | Admitting: Internal Medicine

## 2023-08-12 ENCOUNTER — Encounter: Payer: Self-pay | Admitting: Internal Medicine

## 2023-08-12 DIAGNOSIS — M5126 Other intervertebral disc displacement, lumbar region: Secondary | ICD-10-CM

## 2023-08-12 MED ORDER — TRAMADOL-ACETAMINOPHEN 37.5-325 MG PO TABS: 1.0000 | ORAL_TABLET | Freq: Four times a day (QID) | ORAL | 0 refills | Status: DC | PRN

## 2023-08-12 NOTE — Telephone Encounter (Signed)
 Please review.  KP

## 2023-09-10 ENCOUNTER — Encounter: Payer: Self-pay | Admitting: Internal Medicine

## 2023-09-10 ENCOUNTER — Other Ambulatory Visit: Payer: Self-pay | Admitting: Internal Medicine

## 2023-09-10 DIAGNOSIS — M5126 Other intervertebral disc displacement, lumbar region: Secondary | ICD-10-CM

## 2023-09-10 MED ORDER — TRAMADOL-ACETAMINOPHEN 37.5-325 MG PO TABS: 1.0000 | ORAL_TABLET | Freq: Four times a day (QID) | ORAL | 0 refills | Status: DC | PRN

## 2023-09-10 NOTE — Telephone Encounter (Signed)
 Please review.  KP

## 2023-09-25 ENCOUNTER — Other Ambulatory Visit: Payer: Self-pay

## 2023-09-25 DIAGNOSIS — Z3041 Encounter for surveillance of contraceptive pills: Secondary | ICD-10-CM

## 2023-09-26 ENCOUNTER — Encounter: Payer: Self-pay | Admitting: Internal Medicine

## 2023-09-26 MED ORDER — LEVONORGESTREL-ETHINYL ESTRAD 0.1-20 MG-MCG PO TABS: 1.0000 | ORAL_TABLET | Freq: Every day | ORAL | 1 refills | Status: DC

## 2023-11-17 ENCOUNTER — Encounter: Payer: Self-pay | Admitting: Internal Medicine

## 2023-12-01 ENCOUNTER — Other Ambulatory Visit: Payer: Self-pay | Admitting: Internal Medicine

## 2023-12-01 ENCOUNTER — Encounter: Payer: Self-pay | Admitting: Internal Medicine

## 2023-12-01 DIAGNOSIS — M5126 Other intervertebral disc displacement, lumbar region: Secondary | ICD-10-CM

## 2023-12-02 ENCOUNTER — Other Ambulatory Visit: Payer: Self-pay | Admitting: Internal Medicine

## 2023-12-02 DIAGNOSIS — M5126 Other intervertebral disc displacement, lumbar region: Secondary | ICD-10-CM

## 2023-12-02 MED ORDER — TRAMADOL-ACETAMINOPHEN 37.5-325 MG PO TABS: 1.0000 | ORAL_TABLET | Freq: Four times a day (QID) | ORAL | 0 refills | Status: DC | PRN

## 2023-12-02 NOTE — Progress Notes (Unsigned)
 Date:  12/02/2023   Name:  April Fernandez   DOB:  18-Feb-1971   MRN:  [id number]   Chief Complaint: No chief complaint on file.  HPI  Review of Systems   Lab Results  Component Value Date   NA 137 12/11/2022   K 4.3 12/11/2022   CO2 21 12/11/2022   GLUCOSE 86 12/11/2022   BUN 8 12/11/2022   CREATININE 0.92 12/11/2022   CALCIUM 9.6 12/11/2022   EGFR 74 12/11/2022   GFRNONAA 73 05/27/2019   Lab Results  Component Value Date   CHOL 225 (H) 12/11/2022   HDL 48 12/11/2022   LDLCALC 163 (H) 12/11/2022   TRIG 77 12/11/2022   CHOLHDL 4.7 (H) 12/11/2022   Lab Results  Component Value Date   TSH 0.991 12/11/2022   Lab Results  Component Value Date   HGBA1C 6.1 (H) 12/11/2022   Lab Results  Component Value Date   WBC 5.7 12/11/2022   HGB 11.6 12/11/2022   HCT 34.9 12/11/2022   MCV 90 12/11/2022   PLT 309 12/11/2022   Lab Results  Component Value Date   ALT 6 12/11/2022   AST 14 12/11/2022   ALKPHOS 49 12/11/2022   BILITOT 0.4 12/11/2022   No results found for: MARIEN BOLLS, VD25OH   Patient Active Problem List   Diagnosis Date Noted   Calculus of gallbladder without cholecystitis without obstruction 06/13/2023   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   Ventral hernia with bowel obstruction 03/04/2022   LGSIL on Pap smear of cervix 10/08/2021   Chronic SI joint pain 11/03/2020   Greater trochanteric bursitis of left hip 11/03/2020   Osteoarthritis of spine with radiculopathy, lumbar region 11/03/2020   Mixed hyperlipidemia 05/28/2019   Displacement of lumbar intervertebral disc without myelopathy 05/27/2019   Enthesopathy of hip region 05/27/2019   Fibromyositis 05/27/2019   Trigger finger of both hands 05/05/2015   Essential hypertension 01/09/2015   Degeneration of intervertebral disc of lumbar region with discogenic back pain and lower extremity pain 01/09/2015   Allergic rhinitis 01/09/2015   Arthritis of hand 01/09/2015   Tobacco use  disorder 01/09/2015   Arthralgia of hip or thigh 08/18/2009   Awareness of heartbeats 08/18/2009    Allergies  Allergen Reactions   Fish-Derived Products Hives and Swelling   Other Hives    Tree Nuts   Shellfish Allergy Hives   Fish Allergy Other (See Comments)   Peanut-Containing Drug Products Other (See Comments)   Hydrochlorothiazide Palpitations    Past Surgical History:  Procedure Laterality Date   lumbar ESI  2007    Social History   Tobacco Use   Smoking status: Every Day    Current packs/day: 0.50    Average packs/day: 0.5 packs/day for 37.0 years (18.5 ttl pk-yrs)    Types: Cigarettes   Smokeless tobacco: Never  Vaping Use   Vaping status: Former   Quit date: 05/27/2016  Substance Use Topics   Alcohol use: Not Currently   Drug use: Never     Medication list has been reviewed and updated.  No outpatient medications have been marked as taking for the 12/02/23 encounter (Orders Only) with [name], MD.       06/13/2023    9:52 AM 12/11/2022    9:41 AM 04/25/2022   11:06 AM 10/09/2021   10:09 AM  GAD 7 : Generalized Anxiety Score  Nervous, Anxious, on Edge 0 0 0 0  Control/stop worrying 0 0 0 0  Worry too  much - different things 0 0 0 0  Trouble relaxing 0 0 0 0  Restless 0 0 0 0  Easily annoyed or irritable 0 0 0 0  Afraid - awful might happen 0 0 0 0  Total GAD 7 Score 0 0 0 0  Anxiety Difficulty Not difficult at all Not difficult at all Not difficult at all Not difficult at all       06/13/2023    9:52 AM 12/11/2022    9:41 AM 04/25/2022   11:06 AM  Depression screen PHQ 2/9  Decreased Interest 0 0 0  Down, Depressed, Hopeless 0 0 0  PHQ - 2 Score 0 0 0  Altered sleeping 1 0 0  Tired, decreased energy 1 3 2   Change in appetite 1 0 0  Feeling bad or failure about yourself  0 0 0  Trouble concentrating 0 0 0  Moving slowly or fidgety/restless 0 1 0  Suicidal thoughts 0 0 0  PHQ-9 Score 3 4 2   Difficult doing work/chores Not difficult  at all Not difficult at all Not difficult at all    BP Readings from Last 3 Encounters:  06/13/23 118/70  04/21/23 124/74  12/11/22 128/66    Physical Exam  Wt Readings from Last 3 Encounters:  06/13/23 134 lb (60.8 kg)  04/21/23 135 lb (61.2 kg)  12/11/22 141 lb 6.4 oz (64.1 kg)    There were no vitals taken for this visit.  Assessment and Plan:  Problem List Items Addressed This Visit   None   No follow-ups on file.    Leita HILARIO Adie, MD [facility] [place]

## 2023-12-15 ENCOUNTER — Other Ambulatory Visit (HOSPITAL_COMMUNITY)
Admission: RE | Admit: 2023-12-15 | Discharge: 2023-12-15 | Disposition: A | Discharge status: Home / Self Care | Source: Ambulatory Visit | Attending: Internal Medicine | Admitting: Internal Medicine

## 2023-12-15 ENCOUNTER — Encounter: Payer: Self-pay | Admitting: Internal Medicine

## 2023-12-15 ENCOUNTER — Ambulatory Visit: Payer: Self-pay | Admitting: Internal Medicine

## 2023-12-15 VITALS — BP 124/78 | HR 78 | Ht 62.0 in | Wt 130.0 lb

## 2023-12-15 DIAGNOSIS — Z0000 Encounter for general adult medical examination without abnormal findings: Secondary | ICD-10-CM

## 2023-12-15 DIAGNOSIS — R87612 Low grade squamous intraepithelial lesion on cytologic smear of cervix (LGSIL): Secondary | ICD-10-CM | POA: Diagnosis present

## 2023-12-15 DIAGNOSIS — M51362 Other intervertebral disc degeneration, lumbar region with discogenic back pain and lower extremity pain: Secondary | ICD-10-CM | POA: Diagnosis not present

## 2023-12-15 DIAGNOSIS — I10 Essential (primary) hypertension: Secondary | ICD-10-CM

## 2023-12-15 DIAGNOSIS — Z1211 Encounter for screening for malignant neoplasm of colon: Secondary | ICD-10-CM | POA: Diagnosis not present

## 2023-12-15 DIAGNOSIS — Z1231 Encounter for screening mammogram for malignant neoplasm of breast: Secondary | ICD-10-CM

## 2023-12-15 DIAGNOSIS — F17200 Nicotine dependence, unspecified, uncomplicated: Secondary | ICD-10-CM

## 2023-12-15 DIAGNOSIS — E782 Mixed hyperlipidemia: Secondary | ICD-10-CM | POA: Diagnosis not present

## 2023-12-15 MED ORDER — OLMESARTAN MEDOXOMIL 40 MG PO TABS
ORAL_TABLET | ORAL | 1 refills | Status: AC
Start: 2023-12-15 — End: ?

## 2023-12-15 MED ORDER — AMLODIPINE BESYLATE 10 MG PO TABS: 10.0000 mg | ORAL_TABLET | Freq: Every day | ORAL | 1 refills | Status: AC

## 2023-12-15 NOTE — Assessment & Plan Note (Deleted)
 Unchanged chronic back pain Worsened by weather changes, different activities Takes between 6-8 tramadol  per day.

## 2023-12-15 NOTE — Assessment & Plan Note (Signed)
 Pap repeated today.

## 2023-12-15 NOTE — Progress Notes (Signed)
 Date:  12/15/2023   Name:  April Fernandez   DOB:  01-28-1971   MRN:  [id number]   Chief Complaint: Annual Exam (Breast exam with Pap.) April Fernandez is a 53 y.o. female who presents today for her Complete Annual Exam. She feels well. She reports exercising - none. She reports she is sleeping poorly. Breast complaints - none.  Health Maintenance  Topic Date Due   Pneumococcal Vaccination (1 of 2 - PCV) Never done   Hepatitis B Vaccine (1 of 3 - 19+ 3-dose series) Never done   COVID-19 Vaccine (4 - 2024-25 season) 01/26/2023   Cologuard (Stool DNA test)  10/30/2023   Pap Smear  12/11/2023   Mammogram  12/14/2024*   Flu Shot  12/26/2023   DTaP/Tdap/Td vaccine (2 - Td or Tdap) 12/07/2024   Hepatitis C Screening  Completed   HIV Screening  Completed   Zoster (Shingles) Vaccine  Completed   HPV Vaccine  Aged Out   Meningitis B Vaccine  Aged Out  *Topic was postponed. The date shown is not the original due date.    Hypertension Pertinent negatives include no chest pain, headaches, palpitations or shortness of breath.  Back Pain This is a chronic problem. The problem occurs daily. The problem is unchanged. The pain is present in the lumbar spine. The quality of the pain is described as aching. The pain is Worse during the day. The symptoms are aggravated by bending and twisting. Pertinent negatives include no abdominal pain, chest pain, headaches or weakness. She has tried muscle relaxant, NSAIDs and analgesics for the symptoms. The treatment provided significant relief.    Review of Systems  Constitutional:  Negative for fatigue and unexpected weight change.  HENT:  Negative for trouble swallowing.   Eyes:  Negative for visual disturbance.  Respiratory:  Negative for cough, chest tightness, shortness of breath and wheezing.   Cardiovascular:  Negative for chest pain, palpitations and leg swelling.  Gastrointestinal:  Negative for abdominal pain, constipation and diarrhea.   Musculoskeletal:  Positive for back pain. Negative for arthralgias and myalgias.  Neurological:  Negative for dizziness, weakness, light-headedness and headaches.     Lab Results  Component Value Date   NA 137 12/11/2022   K 4.3 12/11/2022   CO2 21 12/11/2022   GLUCOSE 86 12/11/2022   BUN 8 12/11/2022   CREATININE 0.92 12/11/2022   CALCIUM 9.6 12/11/2022   EGFR 74 12/11/2022   GFRNONAA 73 05/27/2019   Lab Results  Component Value Date   CHOL 225 (H) 12/11/2022   HDL 48 12/11/2022   LDLCALC 163 (H) 12/11/2022   TRIG 77 12/11/2022   CHOLHDL 4.7 (H) 12/11/2022   Lab Results  Component Value Date   TSH 0.991 12/11/2022   Lab Results  Component Value Date   HGBA1C 6.1 (H) 12/11/2022   Lab Results  Component Value Date   WBC 5.7 12/11/2022   HGB 11.6 12/11/2022   HCT 34.9 12/11/2022   MCV 90 12/11/2022   PLT 309 12/11/2022   Lab Results  Component Value Date   ALT 6 12/11/2022   AST 14 12/11/2022   ALKPHOS 49 12/11/2022   BILITOT 0.4 12/11/2022   No results found for: MARIEN BOLLS, VD25OH   Patient Active Problem List   Diagnosis Date Noted   Calculus of gallbladder without cholecystitis without obstruction 06/13/2023   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   Ventral hernia with bowel obstruction 03/04/2022   LGSIL on Pap smear of cervix 10/08/2021  Chronic SI joint pain 11/03/2020   Greater trochanteric bursitis of left hip 11/03/2020   Osteoarthritis of spine with radiculopathy, lumbar region 11/03/2020   Mixed hyperlipidemia 05/28/2019   Enthesopathy of hip region 05/27/2019   Fibromyositis 05/27/2019   Trigger finger of both hands 05/05/2015   Essential hypertension 01/09/2015   Degeneration of intervertebral disc of lumbar region with discogenic back pain and lower extremity pain 01/09/2015   Allergic rhinitis 01/09/2015   Arthritis of hand 01/09/2015   Tobacco use disorder 01/09/2015   Arthralgia of hip or thigh 08/18/2009   Awareness of  heartbeats 08/18/2009    Allergies  Allergen Reactions   Fish-Derived Products Hives and Swelling   Other Hives    Tree Nuts   Shellfish Allergy Hives   Fish Allergy Other (See Comments)   Peanut-Containing Drug Products Other (See Comments)   Hydrochlorothiazide Palpitations    Past Surgical History:  Procedure Laterality Date   lumbar ESI  2007    Social History   Tobacco Use   Smoking status: Every Day    Current packs/day: 0.50    Average packs/day: 0.5 packs/day for 38.6 years (19.3 ttl pk-yrs)    Types: Cigarettes    Start date: 60   Smokeless tobacco: Never  Vaping Use   Vaping status: Former   Quit date: 05/27/2016  Substance Use Topics   Alcohol use: Not Currently   Drug use: Never     Medication list has been reviewed and updated.  Current Meds  Medication Sig   cyclobenzaprine  (FLEXERIL ) 5 MG tablet Take 1 tablet (5 mg total) by mouth at bedtime as needed for muscle spasms.   levonorgestrel -ethinyl estradiol (VIENVA) 0.1-20 MG-MCG tablet Take 1 tablet by mouth daily.   meloxicam  (MOBIC ) 15 MG tablet Take 1 tablet (15 mg total) by mouth daily.   traMADol -acetaminophen  (ULTRACET ) 37.5-325 MG tablet Take 1-2 tablets by mouth every 6 (six) hours as needed.   [DISCONTINUED] amLODipine  (NORVASC ) 10 MG tablet TAKE 1 TABLET(10 MG) BY MOUTH DAILY   [DISCONTINUED] olmesartan  (BENICAR ) 40 MG tablet TAKE 1 TABLET(40 MG) BY MOUTH DAILY       12/15/2023    9:36 AM 06/13/2023    9:52 AM 12/11/2022    9:41 AM 04/25/2022   11:06 AM  GAD 7 : Generalized Anxiety Score  Nervous, Anxious, on Edge 0 0 0 0  Control/stop worrying 0 0 0 0  Worry too much - different things 0 0 0 0  Trouble relaxing 0 0 0 0  Restless 0 0 0 0  Easily annoyed or irritable 0 0 0 0  Afraid - awful might happen 0 0 0 0  Total GAD 7 Score 0 0 0 0  Anxiety Difficulty Not difficult at all Not difficult at all Not difficult at all Not difficult at all       12/15/2023    9:36 AM 06/13/2023     9:52 AM 12/11/2022    9:41 AM  Depression screen PHQ 2/9  Decreased Interest 0 0 0  Down, Depressed, Hopeless 0 0 0  PHQ - 2 Score 0 0 0  Altered sleeping 1 1 0  Tired, decreased energy 1 1 3   Change in appetite 0 1 0  Feeling bad or failure about yourself  0 0 0  Trouble concentrating 0 0 0  Moving slowly or fidgety/restless 0 0 1  Suicidal thoughts  0 0  PHQ-9 Score 2 3 4   Difficult doing work/chores Not difficult at all  Not difficult at all Not difficult at all    BP Readings from Last 3 Encounters:  12/15/23 124/78  06/13/23 118/70  04/21/23 124/74    Physical Exam Vitals and nursing note reviewed.  Constitutional:      General: She is not in acute distress.    Appearance: She is well-developed.  HENT:     Head: Normocephalic and atraumatic.     Right Ear: Tympanic membrane and ear canal normal.     Left Ear: Tympanic membrane and ear canal normal.     Nose:     Right Sinus: No maxillary sinus tenderness.     Left Sinus: No maxillary sinus tenderness.  Eyes:     General: No scleral icterus.       Right eye: No discharge.        Left eye: No discharge.     Conjunctiva/sclera: Conjunctivae normal.  Neck:     Thyroid: No thyromegaly.     Vascular: No carotid bruit.  Cardiovascular:     Rate and Rhythm: Normal rate and regular rhythm.     Pulses: Normal pulses.     Heart sounds: Normal heart sounds.  Pulmonary:     Effort: Pulmonary effort is normal. No respiratory distress.     Breath sounds: No wheezing.  Abdominal:     General: Bowel sounds are normal.     Palpations: Abdomen is soft.     Tenderness: There is no abdominal tenderness.  Genitourinary:    Pubic Area: No rash.      Labia:        Right: No tenderness, lesion or injury.        Left: No tenderness, lesion or injury.      Vagina: Normal.     Cervix: Normal.     Uterus: Normal.      Adnexa: Right adnexa normal and left adnexa normal.     Comments: Endocervical polyp seen in the os Pap obtained  - no bleeding noted Musculoskeletal:     Cervical back: Normal range of motion. No erythema.     Right lower leg: No edema.     Left lower leg: No edema.  Lymphadenopathy:     Cervical: No cervical adenopathy.  Skin:    General: Skin is warm and dry.     Findings: No rash.  Neurological:     Mental Status: She is alert and oriented to person, place, and time.     Cranial Nerves: No cranial nerve deficit.     Sensory: No sensory deficit.     Deep Tendon Reflexes: Reflexes are normal and symmetric.  Psychiatric:        Attention and Perception: Attention normal.        Mood and Affect: Mood normal.     Wt Readings from Last 3 Encounters:  12/15/23 130 lb (59 kg)  06/13/23 134 lb (60.8 kg)  04/21/23 135 lb (61.2 kg)    BP 124/78   Pulse 78   Ht 5' 2 (1.575 m)   Wt 130 lb (59 kg)   SpO2 99%   BMI 23.78 kg/m   Assessment and Plan:  Problem List Items Addressed This Visit       Unprioritized   Essential hypertension (Chronic)   Blood pressure is well controlled.  Current medications amlodipine  and olmesartan . Will continue same regimen along with efforts to limit dietary sodium.       Relevant Medications   amLODipine  (NORVASC ) 10 MG tablet  olmesartan  (BENICAR ) 40 MG tablet   Other Relevant Orders   CBC with Differential/Platelet   Comprehensive metabolic panel with GFR   TSH   Urinalysis, Routine w reflex microscopic   Tobacco use disorder (Chronic)   She continues to smoke about 1/2 ppd Does not qualify for LDCT until next year due to total pk-yrs. Prevnar 20 recommended - she declines      Mixed hyperlipidemia (Chronic)   CAD risk average - she does continue to smoke She is not interested in statin medication Will check lipids and advise      Relevant Medications   amLODipine  (NORVASC ) 10 MG tablet   olmesartan  (BENICAR ) 40 MG tablet   Other Relevant Orders   Lipid panel   LGSIL on Pap smear of cervix (Chronic)   Pap repeated today.       Relevant Orders   Cytology - PAP   Degeneration of intervertebral disc of lumbar region with discogenic back pain and lower extremity pain   Unchanged chronic back pain Worsened by weather changes, different activities Takes between 6-8 tramadol  per day.  Average around 200 tabs/mo No evidence of misuse or abuse. Discussed my retirement and the distinct possibility that a new PCP might require her to return to pain management      Other Visit Diagnoses       Annual physical exam    -  Primary   Relevant Orders   CBC with Differential/Platelet   Comprehensive metabolic panel with GFR   Hemoglobin A1c   Lipid panel   TSH   Urinalysis, Routine w reflex microscopic   HIV Antibody (routine testing w rflx)   Cologuard     Encounter for screening mammogram for breast cancer       she continues to refuse mammograms     Colon cancer screening       due for Cologuard   Relevant Orders   Cologuard       No follow-ups on file.    Leita HILARIO Adie, MD [facility] [place]

## 2023-12-15 NOTE — Assessment & Plan Note (Signed)
 CAD risk average - she does continue to smoke She is not interested in statin medication Will check lipids and advise

## 2023-12-15 NOTE — Assessment & Plan Note (Addendum)
 She continues to smoke about 1/2 ppd Does not qualify for LDCT until next year due to total pk-yrs. Prevnar 20 recommended - she declines

## 2023-12-15 NOTE — Assessment & Plan Note (Signed)
 Unchanged chronic back pain Worsened by weather changes, different activities Takes between 6-8 tramadol  per day.  Average around 200 tabs/mo No evidence of misuse or abuse. Discussed my retirement and the distinct possibility that a new PCP might require her to return to pain management

## 2023-12-15 NOTE — Assessment & Plan Note (Signed)
 Blood pressure is well controlled.  Current medications amlodipine  and olmesartan. Will continue same regimen along with efforts to limit dietary sodium.

## 2023-12-18 ENCOUNTER — Encounter: Payer: Self-pay | Admitting: Internal Medicine

## 2023-12-18 LAB — CYTOLOGY - PAP
Comment: NEGATIVE
High risk HPV: POSITIVE — AB

## 2023-12-19 ENCOUNTER — Ambulatory Visit: Payer: Self-pay | Admitting: Internal Medicine

## 2023-12-19 ENCOUNTER — Other Ambulatory Visit: Payer: Self-pay

## 2023-12-19 MED ORDER — FLUCONAZOLE 150 MG PO TABS: 150.0000 mg | ORAL_TABLET | Freq: Once | ORAL | 0 refills | Status: AC

## 2024-01-01 ENCOUNTER — Other Ambulatory Visit: Payer: Self-pay | Admitting: Internal Medicine

## 2024-01-01 ENCOUNTER — Encounter: Payer: Self-pay | Admitting: Internal Medicine

## 2024-01-01 ENCOUNTER — Other Ambulatory Visit: Payer: Self-pay | Admitting: Physician Assistant

## 2024-01-01 DIAGNOSIS — M5126 Other intervertebral disc displacement, lumbar region: Secondary | ICD-10-CM

## 2024-01-01 NOTE — Telephone Encounter (Signed)
 Please review.  KP

## 2024-01-02 ENCOUNTER — Other Ambulatory Visit: Payer: Self-pay | Admitting: Internal Medicine

## 2024-01-02 DIAGNOSIS — M5126 Other intervertebral disc displacement, lumbar region: Secondary | ICD-10-CM

## 2024-01-02 MED ORDER — TRAMADOL-ACETAMINOPHEN 37.5-325 MG PO TABS: 1.0000 | ORAL_TABLET | Freq: Four times a day (QID) | ORAL | 0 refills | Status: DC | PRN

## 2024-01-02 NOTE — Progress Notes (Unsigned)
 Date:  01/02/2024   Name:  April Fernandez   DOB:  01/14/1971   MRN:  [id number]   Chief Complaint: No chief complaint on file.  HPI  Review of Systems   Lab Results  Component Value Date   NA 137 12/11/2022   K 4.3 12/11/2022   CO2 21 12/11/2022   GLUCOSE 86 12/11/2022   BUN 8 12/11/2022   CREATININE 0.92 12/11/2022   CALCIUM 9.6 12/11/2022   EGFR 74 12/11/2022   GFRNONAA 73 05/27/2019   Lab Results  Component Value Date   CHOL 225 (H) 12/11/2022   HDL 48 12/11/2022   LDLCALC 163 (H) 12/11/2022   TRIG 77 12/11/2022   CHOLHDL 4.7 (H) 12/11/2022   Lab Results  Component Value Date   TSH 0.991 12/11/2022   Lab Results  Component Value Date   HGBA1C 6.1 (H) 12/11/2022   Lab Results  Component Value Date   WBC 5.7 12/11/2022   HGB 11.6 12/11/2022   HCT 34.9 12/11/2022   MCV 90 12/11/2022   PLT 309 12/11/2022   Lab Results  Component Value Date   ALT 6 12/11/2022   AST 14 12/11/2022   ALKPHOS 49 12/11/2022   BILITOT 0.4 12/11/2022   No results found for: MARIEN BOLLS, VD25OH   Patient Active Problem List   Diagnosis Date Noted   Calculus of gallbladder without cholecystitis without obstruction 06/13/2023   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   Ventral hernia with bowel obstruction 03/04/2022   LGSIL on Pap smear of cervix 10/08/2021   Chronic SI joint pain 11/03/2020   Greater trochanteric bursitis of left hip 11/03/2020   Osteoarthritis of spine with radiculopathy, lumbar region 11/03/2020   Mixed hyperlipidemia 05/28/2019   Enthesopathy of hip region 05/27/2019   Fibromyositis 05/27/2019   Trigger finger of both hands 05/05/2015   Essential hypertension 01/09/2015   Degeneration of intervertebral disc of lumbar region with discogenic back pain and lower extremity pain 01/09/2015   Allergic rhinitis 01/09/2015   Arthritis of hand 01/09/2015   Tobacco use disorder 01/09/2015   Arthralgia of hip or thigh 08/18/2009   Awareness of  heartbeats 08/18/2009    Allergies  Allergen Reactions   Fish-Derived Products Hives and Swelling   Other Hives    Tree Nuts   Shellfish Allergy Hives   Fish Allergy Other (See Comments)   Peanut-Containing Drug Products Other (See Comments)   Hydrochlorothiazide Palpitations    Past Surgical History:  Procedure Laterality Date   lumbar ESI  2007    Social History   Tobacco Use   Smoking status: Every Day    Current packs/day: 0.50    Average packs/day: 0.5 packs/day for 38.6 years (19.3 ttl pk-yrs)    Types: Cigarettes    Start date: 45   Smokeless tobacco: Never  Vaping Use   Vaping status: Former   Quit date: 05/27/2016  Substance Use Topics   Alcohol use: Not Currently   Drug use: Never     Medication list has been reviewed and updated.  No outpatient medications have been marked as taking for the 01/02/24 encounter (Orders Only) with [name], MD.       12/15/2023    9:36 AM 06/13/2023    9:52 AM 12/11/2022    9:41 AM 04/25/2022   11:06 AM  GAD 7 : Generalized Anxiety Score  Nervous, Anxious, on Edge 0 0 0 0  Control/stop worrying 0 0 0 0  Worry too much - different  things 0 0 0 0  Trouble relaxing 0 0 0 0  Restless 0 0 0 0  Easily annoyed or irritable 0 0 0 0  Afraid - awful might happen 0 0 0 0  Total GAD 7 Score 0 0 0 0  Anxiety Difficulty Not difficult at all Not difficult at all Not difficult at all Not difficult at all       12/15/2023    9:36 AM 06/13/2023    9:52 AM 12/11/2022    9:41 AM  Depression screen PHQ 2/9  Decreased Interest 0 0 0  Down, Depressed, Hopeless 0 0 0  PHQ - 2 Score 0 0 0  Altered sleeping 1 1 0  Tired, decreased energy 1 1 3   Change in appetite 0 1 0  Feeling bad or failure about yourself  0 0 0  Trouble concentrating 0 0 0  Moving slowly or fidgety/restless 0 0 1  Suicidal thoughts  0 0  PHQ-9 Score 2 3 4   Difficult doing work/chores Not difficult at all Not difficult at all Not difficult at all    BP  Readings from Last 3 Encounters:  12/15/23 124/78  06/13/23 118/70  04/21/23 124/74    Physical Exam  Wt Readings from Last 3 Encounters:  12/15/23 130 lb (59 kg)  06/13/23 134 lb (60.8 kg)  04/21/23 135 lb (61.2 kg)    There were no vitals taken for this visit.  Assessment and Plan:  Problem List Items Addressed This Visit   None   No follow-ups on file.    Leita HILARIO Adie, MD [facility] [place]

## 2024-02-05 ENCOUNTER — Encounter: Payer: Self-pay | Admitting: Internal Medicine

## 2024-02-05 ENCOUNTER — Other Ambulatory Visit: Payer: Self-pay | Admitting: Internal Medicine

## 2024-02-05 DIAGNOSIS — M5126 Other intervertebral disc displacement, lumbar region: Secondary | ICD-10-CM

## 2024-02-05 MED ORDER — TRAMADOL-ACETAMINOPHEN 37.5-325 MG PO TABS: 1.0000 | ORAL_TABLET | Freq: Four times a day (QID) | ORAL | 0 refills | Status: DC | PRN

## 2024-02-05 NOTE — Progress Notes (Unsigned)
 Date:  02/05/2024   Name:  April Fernandez   DOB:  1970-06-27   MRN:  [id number]   Chief Complaint: No chief complaint on file.  HPI  Review of Systems   Lab Results  Component Value Date   NA 137 12/11/2022   K 4.3 12/11/2022   CO2 21 12/11/2022   GLUCOSE 86 12/11/2022   BUN 8 12/11/2022   CREATININE 0.92 12/11/2022   CALCIUM 9.6 12/11/2022   EGFR 74 12/11/2022   GFRNONAA 73 05/27/2019   Lab Results  Component Value Date   CHOL 225 (H) 12/11/2022   HDL 48 12/11/2022   LDLCALC 163 (H) 12/11/2022   TRIG 77 12/11/2022   CHOLHDL 4.7 (H) 12/11/2022   Lab Results  Component Value Date   TSH 0.991 12/11/2022   Lab Results  Component Value Date   HGBA1C 6.1 (H) 12/11/2022   Lab Results  Component Value Date   WBC 5.7 12/11/2022   HGB 11.6 12/11/2022   HCT 34.9 12/11/2022   MCV 90 12/11/2022   PLT 309 12/11/2022   Lab Results  Component Value Date   ALT 6 12/11/2022   AST 14 12/11/2022   ALKPHOS 49 12/11/2022   BILITOT 0.4 12/11/2022   No results found for: MARIEN BOLLS, VD25OH   Patient Active Problem List   Diagnosis Date Noted   Calculus of gallbladder without cholecystitis without obstruction 06/13/2023   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   Ventral hernia with bowel obstruction 03/04/2022   LGSIL on Pap smear of cervix 10/08/2021   Chronic SI joint pain 11/03/2020   Greater trochanteric bursitis of left hip 11/03/2020   Osteoarthritis of spine with radiculopathy, lumbar region 11/03/2020   Mixed hyperlipidemia 05/28/2019   Enthesopathy of hip region 05/27/2019   Fibromyositis 05/27/2019   Trigger finger of both hands 05/05/2015   Essential hypertension 01/09/2015   Degeneration of intervertebral disc of lumbar region with discogenic back pain and lower extremity pain 01/09/2015   Allergic rhinitis 01/09/2015   Arthritis of hand 01/09/2015   Tobacco use disorder 01/09/2015   Arthralgia of hip or thigh 08/18/2009   Awareness of  heartbeats 08/18/2009    Allergies  Allergen Reactions   Fish-Derived Products Hives and Swelling   Other Hives    Tree Nuts   Shellfish Allergy Hives   Fish Allergy Other (See Comments)   Peanut-Containing Drug Products Other (See Comments)   Hydrochlorothiazide Palpitations    Past Surgical History:  Procedure Laterality Date   lumbar ESI  2007    Social History   Tobacco Use   Smoking status: Every Day    Current packs/day: 0.50    Average packs/day: 0.5 packs/day for 38.7 years (19.3 ttl pk-yrs)    Types: Cigarettes    Start date: 73   Smokeless tobacco: Never  Vaping Use   Vaping status: Former   Quit date: 05/27/2016  Substance Use Topics   Alcohol use: Not Currently   Drug use: Never     Medication list has been reviewed and updated.  No outpatient medications have been marked as taking for the 02/05/24 encounter (Orders Only) with [name], MD.       12/15/2023    9:36 AM 06/13/2023    9:52 AM 12/11/2022    9:41 AM 04/25/2022   11:06 AM  GAD 7 : Generalized Anxiety Score  Nervous, Anxious, on Edge 0 0 0 0  Control/stop worrying 0 0 0 0  Worry too much - different  things 0 0 0 0  Trouble relaxing 0 0 0 0  Restless 0 0 0 0  Easily annoyed or irritable 0 0 0 0  Afraid - awful might happen 0 0 0 0  Total GAD 7 Score 0 0 0 0  Anxiety Difficulty Not difficult at all Not difficult at all Not difficult at all Not difficult at all       12/15/2023    9:36 AM 06/13/2023    9:52 AM 12/11/2022    9:41 AM  Depression screen PHQ 2/9  Decreased Interest 0 0 0  Down, Depressed, Hopeless 0 0 0  PHQ - 2 Score 0 0 0  Altered sleeping 1 1 0  Tired, decreased energy 1 1 3   Change in appetite 0 1 0  Feeling bad or failure about yourself  0 0 0  Trouble concentrating 0 0 0  Moving slowly or fidgety/restless 0 0 1  Suicidal thoughts  0 0  PHQ-9 Score 2 3 4   Difficult doing work/chores Not difficult at all Not difficult at all Not difficult at all     BP Readings from Last 3 Encounters:  12/15/23 124/78  06/13/23 118/70  04/21/23 124/74    Physical Exam  Wt Readings from Last 3 Encounters:  12/15/23 130 lb (59 kg)  06/13/23 134 lb (60.8 kg)  04/21/23 135 lb (61.2 kg)    There were no vitals taken for this visit.  Assessment and Plan:  Problem List Items Addressed This Visit   None   No follow-ups on file.    Leita HILARIO Adie, MD [facility] [place]

## 2024-02-05 NOTE — Telephone Encounter (Signed)
 Please review.  KP

## 2024-03-03 ENCOUNTER — Encounter: Payer: Self-pay | Admitting: Internal Medicine

## 2024-03-03 ENCOUNTER — Ambulatory Visit: Admitting: Internal Medicine

## 2024-03-03 VITALS — BP 118/74 | HR 77 | Ht 62.0 in | Wt 135.0 lb

## 2024-03-03 DIAGNOSIS — M65332 Trigger finger, left middle finger: Secondary | ICD-10-CM | POA: Diagnosis not present

## 2024-03-03 DIAGNOSIS — M51362 Other intervertebral disc degeneration, lumbar region with discogenic back pain and lower extremity pain: Secondary | ICD-10-CM

## 2024-03-03 DIAGNOSIS — I10 Essential (primary) hypertension: Secondary | ICD-10-CM | POA: Diagnosis not present

## 2024-03-03 DIAGNOSIS — M5126 Other intervertebral disc displacement, lumbar region: Secondary | ICD-10-CM

## 2024-03-03 MED ORDER — TRAMADOL-ACETAMINOPHEN 37.5-325 MG PO TABS
1.0000 | ORAL_TABLET | Freq: Four times a day (QID) | ORAL | 0 refills | Status: DC | PRN
Start: 2024-03-04 — End: 2024-04-02

## 2024-03-03 NOTE — Progress Notes (Addendum)
 Date:  03/03/2024   Name:  April Fernandez   DOB:  07/18/1970   MRN:  [id number]   Chief Complaint: Hypertension, Hand Pain (Left Hand Pain- palm is swollen and painful. Comes and goes. The more she uses her hand, the worse it gets. X1 month.), and Sciatica (Left leg sciatica- follow up. )  Hypertension This is a chronic problem. The problem is controlled. Pertinent negatives include no chest pain, headaches, palpitations or shortness of breath. Past treatments include calcium channel blockers and angiotensin blockers. The current treatment provides significant improvement. There is no history of kidney disease or CAD/MI.  Hand Pain  There was no injury mechanism. The pain is present in the left hand. The quality of the pain is described as cramping. The pain does not radiate. The pain has been Fluctuating since the incident. Pertinent negatives include no chest pain. Associated symptoms comments: triggering.  Back Pain This is a chronic problem. The problem occurs daily. The pain is present in the lumbar spine. The quality of the pain is described as burning and aching. Pertinent negatives include no abdominal pain, chest pain, headaches or weakness.    Review of Systems  Constitutional:  Negative for appetite change, fatigue and unexpected weight change.  HENT:  Negative for trouble swallowing.   Eyes:  Negative for visual disturbance.  Respiratory:  Negative for cough, chest tightness, shortness of breath and wheezing.   Cardiovascular:  Negative for chest pain, palpitations and leg swelling.  Gastrointestinal:  Negative for abdominal pain, constipation and diarrhea.  Musculoskeletal:  Positive for arthralgias (left hand middle finger trigger) and back pain. Negative for myalgias.  Neurological:  Negative for dizziness, weakness, light-headedness and headaches.  Psychiatric/Behavioral:  Positive for dysphoric mood. The patient is nervous/anxious.      Lab Results  Component Value  Date   NA 137 12/11/2022   K 4.3 12/11/2022   CO2 21 12/11/2022   GLUCOSE 86 12/11/2022   BUN 8 12/11/2022   CREATININE 0.92 12/11/2022   CALCIUM 9.6 12/11/2022   EGFR 74 12/11/2022   GFRNONAA 73 05/27/2019   Lab Results  Component Value Date   CHOL 225 (H) 12/11/2022   HDL 48 12/11/2022   LDLCALC 163 (H) 12/11/2022   TRIG 77 12/11/2022   CHOLHDL 4.7 (H) 12/11/2022   Lab Results  Component Value Date   TSH 0.991 12/11/2022   Lab Results  Component Value Date   HGBA1C 6.1 (H) 12/11/2022   Lab Results  Component Value Date   WBC 5.7 12/11/2022   HGB 11.6 12/11/2022   HCT 34.9 12/11/2022   MCV 90 12/11/2022   PLT 309 12/11/2022   Lab Results  Component Value Date   ALT 6 12/11/2022   AST 14 12/11/2022   ALKPHOS 49 12/11/2022   BILITOT 0.4 12/11/2022   No results found for: MARIEN BOLLS, VD25OH   Patient Active Problem List   Diagnosis Date Noted   Calculus of gallbladder without cholecystitis without obstruction 06/13/2023   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   Ventral hernia with bowel obstruction 03/04/2022   LGSIL on Pap smear of cervix 10/08/2021   Chronic SI joint pain 11/03/2020   Greater trochanteric bursitis of left hip 11/03/2020   Osteoarthritis of spine with radiculopathy, lumbar region 11/03/2020   Mixed hyperlipidemia 05/28/2019   Enthesopathy of hip region 05/27/2019   Fibromyositis 05/27/2019   Trigger middle finger of left hand 05/05/2015   Essential hypertension 01/09/2015   Degeneration of intervertebral disc of lumbar  region with discogenic back pain and lower extremity pain 01/09/2015   Allergic rhinitis 01/09/2015   Arthritis of hand 01/09/2015   Tobacco use disorder 01/09/2015   Arthralgia of hip or thigh 08/18/2009   Awareness of heartbeats 08/18/2009    Allergies  Allergen Reactions   Fish-Derived Products Hives and Swelling   Other Hives    Tree Nuts   Shellfish Allergy Hives   Fish Allergy Other (See Comments)    Peanut-Containing Drug Products Other (See Comments)   Hydrochlorothiazide Palpitations    Past Surgical History:  Procedure Laterality Date   lumbar ESI  2007    Social History   Tobacco Use   Smoking status: Every Day    Current packs/day: 0.50    Average packs/day: 0.5 packs/day for 38.8 years (19.4 ttl pk-yrs)    Types: Cigarettes    Start date: 47   Smokeless tobacco: Never  Vaping Use   Vaping status: Former   Quit date: 05/27/2016  Substance Use Topics   Alcohol use: Not Currently   Drug use: Never     Medication list has been reviewed and updated.  Current Meds  Medication Sig   amLODipine  (NORVASC ) 10 MG tablet Take 1 tablet (10 mg total) by mouth daily.   cyclobenzaprine  (FLEXERIL ) 5 MG tablet Take 1 tablet (5 mg total) by mouth at bedtime as needed for muscle spasms.   levonorgestrel -ethinyl estradiol (VIENVA) 0.1-20 MG-MCG tablet Take 1 tablet by mouth daily.   meloxicam  (MOBIC ) 15 MG tablet Take 1 tablet (15 mg total) by mouth daily.   olmesartan  (BENICAR ) 40 MG tablet TAKE 1 TABLET(40 MG) BY MOUTH DAILY   [DISCONTINUED] traMADol -acetaminophen  (ULTRACET ) 37.5-325 MG tablet Take 1-2 tablets by mouth every 6 (six) hours as needed.       03/03/2024    9:16 AM 12/15/2023    9:36 AM 06/13/2023    9:52 AM 12/11/2022    9:41 AM  GAD 7 : Generalized Anxiety Score  Nervous, Anxious, on Edge 0 0 0 0  Control/stop worrying 0 0 0 0  Worry too much - different things 0 0 0 0  Trouble relaxing 0 0 0 0  Restless 0 0 0 0  Easily annoyed or irritable 0 0 0 0  Afraid - awful might happen 0 0 0 0  Total GAD 7 Score 0 0 0 0  Anxiety Difficulty Not difficult at all Not difficult at all Not difficult at all Not difficult at all       03/03/2024    9:16 AM 12/15/2023    9:36 AM 06/13/2023    9:52 AM  Depression screen PHQ 2/9  Decreased Interest 0 0 0  Down, Depressed, Hopeless 0 0 0  PHQ - 2 Score 0 0 0  Altered sleeping 0 1 1  Tired, decreased energy 0 1 1  Change  in appetite 0 0 1  Feeling bad or failure about yourself  0 0 0  Trouble concentrating 0 0 0  Moving slowly or fidgety/restless 0 0 0  Suicidal thoughts 0  0  PHQ-9 Score 0 2 3  Difficult doing work/chores Not difficult at all Not difficult at all Not difficult at all    BP Readings from Last 3 Encounters:  03/03/24 118/74  12/15/23 124/78  06/13/23 118/70    Physical Exam Vitals and nursing note reviewed.  Constitutional:      General: She is not in acute distress.    Appearance: Normal appearance. She is well-developed.  HENT:     Head: Normocephalic and atraumatic.  Neck:     Vascular: No carotid bruit.  Cardiovascular:     Rate and Rhythm: Normal rate and regular rhythm.     Heart sounds: No murmur heard. Pulmonary:     Effort: Pulmonary effort is normal. No respiratory distress.     Breath sounds: No wheezing or rhonchi.  Musculoskeletal:     Cervical back: Normal range of motion.     Lumbar back: Tenderness present. No spasms. Positive left straight leg raise test.     Right lower leg: No edema.     Left lower leg: No edema.     Comments: Trigger finger left middle finger Palmar fibromatosis and tenderness base of middle finger  Lymphadenopathy:     Cervical: No cervical adenopathy.  Skin:    General: Skin is warm and dry.     Findings: No rash.  Neurological:     Mental Status: She is alert and oriented to person, place, and time.  Psychiatric:        Mood and Affect: Mood normal.        Behavior: Behavior normal.     Wt Readings from Last 3 Encounters:  03/03/24 135 lb (61.2 kg)  12/15/23 130 lb (59 kg)  06/13/23 134 lb (60.8 kg)    BP 118/74   Pulse 77   Ht 5' 2 (1.575 m)   Wt 135 lb (61.2 kg)   SpO2 95%   BMI 24.69 kg/m   Assessment and Plan:  Problem List Items Addressed This Visit       Unprioritized   Degeneration of intervertebral disc of lumbar region with discogenic back pain and lower extremity pain   Seen by [facility] in the  past with all treatments exhausted. Now having more left sided pain radiating to her left foot. Recommend continuing tramadol . New FMLA given. Return to [facility] for further evaluation.      Relevant Medications   traMADol -acetaminophen  (ULTRACET ) 37.5-325 MG tablet (Start on 03/04/2024)   Other Relevant Orders   Ambulatory referral to Orthopedic Surgery   Essential hypertension (Chronic)   Well controlled blood pressure today. Current regimen is olmesartan  and amlodipine . No medication side effects noted. She will get over due labs done today.        Trigger middle finger of left hand - Primary   Progressive pain and stiffness. Refer to Ortho Hand specialist.      Relevant Orders   Ambulatory referral to Orthopedic Surgery   Other Visit Diagnoses       Displacement of lumbar intervertebral disc without myelopathy  (Chronic)      Relevant Medications   traMADol -acetaminophen  (ULTRACET ) 37.5-325 MG tablet (Start on 03/04/2024)       No follow-ups on file.    Leita HILARIO Adie, MD [facility] [place]

## 2024-03-03 NOTE — Assessment & Plan Note (Signed)
 Progressive pain and stiffness. Refer to Ortho Hand specialist.

## 2024-03-03 NOTE — Assessment & Plan Note (Signed)
 Well controlled blood pressure today. Current regimen is olmesartan  and amlodipine . No medication side effects noted. She will get over due labs done today.

## 2024-03-03 NOTE — Patient Instructions (Signed)
 Please submit the Cologuard test as soon as possible.

## 2024-03-03 NOTE — Assessment & Plan Note (Signed)
 Seen by [facility] in the past with all treatments exhausted. Now having more left sided pain radiating to her left foot. Recommend continuing tramadol . New FMLA given. Return to [facility] for further evaluation.

## 2024-03-04 ENCOUNTER — Other Ambulatory Visit: Payer: Self-pay | Admitting: Internal Medicine

## 2024-03-04 DIAGNOSIS — Z3041 Encounter for surveillance of contraceptive pills: Secondary | ICD-10-CM

## 2024-03-04 LAB — LIPID PANEL
Chol/HDL Ratio: 4.1 ratio (ref 0.0–4.4)
Cholesterol, Total: 228 mg/dL — ABNORMAL HIGH (ref 100–199)
HDL: 55 mg/dL (ref >39)
LDL Chol Calc (NIH): 159 mg/dL — ABNORMAL HIGH (ref 0–99)
Triglycerides: 80 mg/dL (ref 0–149)
VLDL Cholesterol Cal: 14 mg/dL (ref 5–40)

## 2024-03-04 LAB — COMPREHENSIVE METABOLIC PANEL WITH GFR
ALT: 9 IU/L (ref 0–32)
AST: 12 IU/L (ref 0–40)
Albumin: 4.5 g/dL (ref 3.8–4.9)
Alkaline Phosphatase: 59 IU/L (ref 49–135)
BUN/Creatinine Ratio: 9 (ref 9–23)
BUN: 8 mg/dL (ref 6–24)
Bilirubin Total: 0.5 mg/dL (ref 0.0–1.2)
CO2: 22 mmol/L (ref 20–29)
Calcium: 9.9 mg/dL (ref 8.7–10.2)
Chloride: 100 mmol/L (ref 96–106)
Creatinine, Ser: 0.89 mg/dL (ref 0.57–1.00)
Globulin, Total: 2.5 g/dL (ref 1.5–4.5)
Glucose: 85 mg/dL (ref 70–99)
Potassium: 4 mmol/L (ref 3.5–5.2)
Sodium: 136 mmol/L (ref 134–144)
Total Protein: 7 g/dL (ref 6.0–8.5)
eGFR: 77 mL/min/1.73 (ref >59)

## 2024-03-04 LAB — CBC WITH DIFFERENTIAL/PLATELET
Basophils Absolute: 0 x10E3/uL (ref 0.0–0.2)
Basos: 0 %
EOS (ABSOLUTE): 0.1 x10E3/uL (ref 0.0–0.4)
Eos: 1 %
Hematocrit: 38.8 % (ref 34.0–46.6)
Hemoglobin: 12.3 g/dL (ref 11.1–15.9)
Immature Grans (Abs): 0 x10E3/uL (ref 0.0–0.1)
Immature Granulocytes: 0 %
Lymphocytes Absolute: 1.9 x10E3/uL (ref 0.7–3.1)
Lymphs: 28 %
MCH: 29.7 pg (ref 26.6–33.0)
MCHC: 31.7 g/dL (ref 31.5–35.7)
MCV: 94 fL (ref 79–97)
Monocytes Absolute: 0.4 x10E3/uL (ref 0.1–0.9)
Monocytes: 6 %
Neutrophils Absolute: 4.5 x10E3/uL (ref 1.4–7.0)
Neutrophils: 65 %
Platelets: 333 x10E3/uL (ref 150–450)
RBC: 4.14 x10E6/uL (ref 3.77–5.28)
RDW: 11.6 % — ABNORMAL LOW (ref 11.7–15.4)
WBC: 6.9 x10E3/uL (ref 3.4–10.8)

## 2024-03-04 LAB — URINALYSIS, ROUTINE W REFLEX MICROSCOPIC
Bilirubin, UA: NEGATIVE
Glucose, UA: NEGATIVE
Ketones, UA: NEGATIVE
Nitrite, UA: NEGATIVE
RBC, UA: NEGATIVE
Specific Gravity, UA: 1.024 (ref 1.005–1.030)
Urobilinogen, Ur: 1 mg/dL (ref 0.2–1.0)
pH, UA: 6 (ref 5.0–7.5)

## 2024-03-04 LAB — HIV ANTIBODY (ROUTINE TESTING W REFLEX): HIV Screen 4th Generation wRfx: NONREACTIVE

## 2024-03-04 LAB — MICROSCOPIC EXAMINATION
Casts: NONE SEEN /LPF
Epithelial Cells (non renal): >10 /HPF — AB (ref 0–10)

## 2024-03-04 LAB — HEMOGLOBIN A1C
Est. average glucose Bld gHb Est-mCnc: 114 mg/dL
Hgb A1c MFr Bld: 5.6 % (ref 4.8–5.6)

## 2024-03-04 LAB — TSH: TSH: 1.05 u[IU]/mL (ref 0.450–4.500)

## 2024-03-05 NOTE — Telephone Encounter (Signed)
 Requested Prescriptions  Pending Prescriptions Disp Refills   VIENVA 0.1-20 MG-MCG tablet [Pharmacy Med Name: VIENVA 0.1MG /0.02MG  TABS 28S] 84 tablet 1    Sig: TAKE 1 TABLET BY MOUTH DAILY     OB/GYN:  Contraceptives Failed - 03/05/2024  3:29 PM      Failed - Patient is not a smoker      Passed - Last BP in normal range    BP Readings from Last 1 Encounters:  03/03/24 118/74         Passed - Valid encounter within last 12 months    Recent Outpatient Visits           2 days ago Trigger middle finger of left hand   [place] [facility], Leita DEL, MD   2 months ago Annual physical exam   [facility] at [facility], Leita DEL, MD

## 2024-03-24 NOTE — Telephone Encounter (Signed)
 FYI? Does she need appt?   JM

## 2024-04-01 ENCOUNTER — Encounter: Payer: Self-pay | Admitting: Internal Medicine

## 2024-04-02 ENCOUNTER — Other Ambulatory Visit: Payer: Self-pay | Admitting: Internal Medicine

## 2024-04-02 ENCOUNTER — Encounter: Payer: Self-pay | Admitting: Internal Medicine

## 2024-04-02 DIAGNOSIS — M5126 Other intervertebral disc displacement, lumbar region: Secondary | ICD-10-CM

## 2024-04-02 DIAGNOSIS — M51362 Other intervertebral disc degeneration, lumbar region with discogenic back pain and lower extremity pain: Secondary | ICD-10-CM

## 2024-04-02 MED ORDER — TRAMADOL-ACETAMINOPHEN 37.5-325 MG PO TABS: 1.0000 | ORAL_TABLET | Freq: Four times a day (QID) | ORAL | 0 refills | Status: DC | PRN

## 2024-04-02 NOTE — Progress Notes (Unsigned)
 Date:  04/02/2024   Name:  April Fernandez   DOB:  1970-12-22   MRN:  [id number]   Chief Complaint: No chief complaint on file.  HPI  Review of Systems   Lab Results  Component Value Date   NA 136 03/03/2024   K 4.0 03/03/2024   CO2 22 03/03/2024   GLUCOSE 85 03/03/2024   BUN 8 03/03/2024   CREATININE 0.89 03/03/2024   CALCIUM 9.9 03/03/2024   EGFR 77 03/03/2024   GFRNONAA 73 05/27/2019   Lab Results  Component Value Date   CHOL 228 (H) 03/03/2024   HDL 55 03/03/2024   LDLCALC 159 (H) 03/03/2024   TRIG 80 03/03/2024   CHOLHDL 4.1 03/03/2024   Lab Results  Component Value Date   TSH 1.050 03/03/2024   Lab Results  Component Value Date   HGBA1C 5.6 03/03/2024   Lab Results  Component Value Date   WBC 6.9 03/03/2024   HGB 12.3 03/03/2024   HCT 38.8 03/03/2024   MCV 94 03/03/2024   PLT 333 03/03/2024   Lab Results  Component Value Date   ALT 9 03/03/2024   AST 12 03/03/2024   ALKPHOS 59 03/03/2024   BILITOT 0.5 03/03/2024   No results found for: MARIEN BOLLS, VD25OH   Patient Active Problem List   Diagnosis Date Noted   Calculus of gallbladder without cholecystitis without obstruction 06/13/2023   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   Ventral hernia with bowel obstruction 03/04/2022   LGSIL on Pap smear of cervix 10/08/2021   Chronic SI joint pain 11/03/2020   Greater trochanteric bursitis of left hip 11/03/2020   Osteoarthritis of spine with radiculopathy, lumbar region 11/03/2020   Mixed hyperlipidemia 05/28/2019   Enthesopathy of hip region 05/27/2019   Fibromyositis 05/27/2019   Trigger middle finger of left hand 05/05/2015   Essential hypertension 01/09/2015   Degeneration of intervertebral disc of lumbar region with discogenic back pain and lower extremity pain 01/09/2015   Allergic rhinitis 01/09/2015   Arthritis of hand 01/09/2015   Tobacco use disorder 01/09/2015   Arthralgia of hip or thigh 08/18/2009   Awareness of  heartbeats 08/18/2009    Allergies  Allergen Reactions   Fish Protein-Containing Drug Products Hives and Swelling   Other Hives    Tree Nuts   Shellfish Allergy Hives   Fish Allergy Other (See Comments)   Peanut-Containing Drug Products Other (See Comments)   Hydrochlorothiazide Palpitations    Past Surgical History:  Procedure Laterality Date   lumbar ESI  2007    Social History   Tobacco Use   Smoking status: Every Day    Current packs/day: 0.50    Average packs/day: 0.5 packs/day for 38.8 years (19.4 ttl pk-yrs)    Types: Cigarettes    Start date: 12   Smokeless tobacco: Never  Vaping Use   Vaping status: Former   Quit date: 05/27/2016  Substance Use Topics   Alcohol use: Not Currently   Drug use: Never     Medication list has been reviewed and updated.  No outpatient medications have been marked as taking for the 04/02/24 encounter (Orders Only) with April April DEL, MD.       03/03/2024    9:16 AM 12/15/2023    9:36 AM 06/13/2023    9:52 AM 12/11/2022    9:41 AM  GAD 7 : Generalized Anxiety Score  Nervous, Anxious, on Edge 0 0 0 0  Control/stop worrying 0 0 0 0  Worry too much -  different things 0 0 0 0  Trouble relaxing 0 0 0 0  Restless 0 0 0 0  Easily annoyed or irritable 0 0 0 0  Afraid - awful might happen 0 0 0 0  Total GAD 7 Score 0 0 0 0  Anxiety Difficulty Not difficult at all Not difficult at all Not difficult at all Not difficult at all       03/03/2024    9:16 AM 12/15/2023    9:36 AM 06/13/2023    9:52 AM  Depression screen PHQ 2/9  Decreased Interest 0 0 0  Down, Depressed, Hopeless 0 0 0  PHQ - 2 Score 0 0 0  Altered sleeping 0 1 1  Tired, decreased energy 0 1 1  Change in appetite 0 0 1  Feeling bad or failure about yourself  0 0 0  Trouble concentrating 0 0 0  Moving slowly or fidgety/restless 0 0 0  Suicidal thoughts 0  0  PHQ-9 Score 0  2  3   Difficult doing work/chores Not difficult at all Not difficult at all Not  difficult at all     Data saved with a previous flowsheet row definition    BP Readings from Last 3 Encounters:  03/03/24 118/74  12/15/23 124/78  06/13/23 118/70    Physical Exam  Wt Readings from Last 3 Encounters:  03/03/24 135 lb (61.2 kg)  12/15/23 130 lb (59 kg)  06/13/23 134 lb (60.8 kg)    There were no vitals taken for this visit.  Assessment and Plan:  Problem List Items Addressed This Visit   None   No follow-ups on file.    April April Adie, MD [facility] [place]

## 2024-04-27 ENCOUNTER — Encounter: Payer: Self-pay | Admitting: Internal Medicine

## 2024-04-30 ENCOUNTER — Other Ambulatory Visit: Payer: Self-pay | Admitting: Internal Medicine

## 2024-04-30 ENCOUNTER — Encounter: Payer: Self-pay | Admitting: Internal Medicine

## 2024-04-30 DIAGNOSIS — M51362 Other intervertebral disc degeneration, lumbar region with discogenic back pain and lower extremity pain: Secondary | ICD-10-CM

## 2024-04-30 DIAGNOSIS — M5126 Other intervertebral disc displacement, lumbar region: Secondary | ICD-10-CM

## 2024-04-30 MED ORDER — TRAMADOL-ACETAMINOPHEN 37.5-325 MG PO TABS: 1.0000 | ORAL_TABLET | Freq: Four times a day (QID) | ORAL | 0 refills | Status: DC | PRN

## 2024-04-30 NOTE — Progress Notes (Unsigned)
 Date:  04/30/2024   Name:  April Fernandez   DOB:  1970/07/03   MRN:  [id number]   Chief Complaint: No chief complaint on file.  HPI  Review of Systems   Lab Results  Component Value Date   NA 136 03/03/2024   K 4.0 03/03/2024   CO2 22 03/03/2024   GLUCOSE 85 03/03/2024   BUN 8 03/03/2024   CREATININE 0.89 03/03/2024   CALCIUM 9.9 03/03/2024   EGFR 77 03/03/2024   GFRNONAA 73 05/27/2019   Lab Results  Component Value Date   CHOL 228 (H) 03/03/2024   HDL 55 03/03/2024   LDLCALC 159 (H) 03/03/2024   TRIG 80 03/03/2024   CHOLHDL 4.1 03/03/2024   Lab Results  Component Value Date   TSH 1.050 03/03/2024   Lab Results  Component Value Date   HGBA1C 5.6 03/03/2024   Lab Results  Component Value Date   WBC 6.9 03/03/2024   HGB 12.3 03/03/2024   HCT 38.8 03/03/2024   MCV 94 03/03/2024   PLT 333 03/03/2024   Lab Results  Component Value Date   ALT 9 03/03/2024   AST 12 03/03/2024   ALKPHOS 59 03/03/2024   BILITOT 0.5 03/03/2024   No results found for: MARIEN BOLLS, VD25OH   Patient Active Problem List   Diagnosis Date Noted   Calculus of gallbladder without cholecystitis without obstruction 06/13/2023   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   Ventral hernia with bowel obstruction 03/04/2022   LGSIL on Pap smear of cervix 10/08/2021   Chronic SI joint pain 11/03/2020   Greater trochanteric bursitis of left hip 11/03/2020   Osteoarthritis of spine with radiculopathy, lumbar region 11/03/2020   Mixed hyperlipidemia 05/28/2019   Enthesopathy of hip region 05/27/2019   Fibromyositis 05/27/2019   Trigger middle finger of left hand 05/05/2015   Essential hypertension 01/09/2015   Degeneration of intervertebral disc of lumbar region with discogenic back pain and lower extremity pain 01/09/2015   Allergic rhinitis 01/09/2015   Arthritis of hand 01/09/2015   Tobacco use disorder 01/09/2015   Arthralgia of hip or thigh 08/18/2009   Awareness of  heartbeats 08/18/2009    Allergies  Allergen Reactions   Fish Protein-Containing Drug Products Hives and Swelling   Other Hives    Tree Nuts   Shellfish Allergy Hives   Fish Allergy Other (See Comments)   Peanut-Containing Drug Products Other (See Comments)   Hydrochlorothiazide Palpitations    Past Surgical History:  Procedure Laterality Date   lumbar ESI  2007    Social History   Tobacco Use   Smoking status: Every Day    Current packs/day: 0.50    Average packs/day: 0.5 packs/day for 38.9 years (19.5 ttl pk-yrs)    Types: Cigarettes    Start date: 35   Smokeless tobacco: Never  Vaping Use   Vaping status: Former   Quit date: 05/27/2016  Substance Use Topics   Alcohol use: Not Currently   Drug use: Never     Medication list has been reviewed and updated.  No outpatient medications have been marked as taking for the 04/30/24 encounter (Orders Only) with [name], MD.       03/03/2024    9:16 AM 12/15/2023    9:36 AM 06/13/2023    9:52 AM 12/11/2022    9:41 AM  GAD 7 : Generalized Anxiety Score  Nervous, Anxious, on Edge 0 0 0 0  Control/stop worrying 0 0 0 0  Worry too much -  different things 0 0 0 0  Trouble relaxing 0 0 0 0  Restless 0 0 0 0  Easily annoyed or irritable 0 0 0 0  Afraid - awful might happen 0 0 0 0  Total GAD 7 Score 0 0 0 0  Anxiety Difficulty Not difficult at all Not difficult at all Not difficult at all Not difficult at all       03/03/2024    9:16 AM 12/15/2023    9:36 AM 06/13/2023    9:52 AM  Depression screen PHQ 2/9  Decreased Interest 0 0 0  Down, Depressed, Hopeless 0 0 0  PHQ - 2 Score 0 0 0  Altered sleeping 0 1 1  Tired, decreased energy 0 1 1  Change in appetite 0 0 1  Feeling bad or failure about yourself  0 0 0  Trouble concentrating 0 0 0  Moving slowly or fidgety/restless 0 0 0  Suicidal thoughts 0  0  PHQ-9 Score 0  2  3   Difficult doing work/chores Not difficult at all Not difficult at all Not  difficult at all     Data saved with a previous flowsheet row definition    BP Readings from Last 3 Encounters:  03/03/24 118/74  12/15/23 124/78  06/13/23 118/70    Physical Exam  Wt Readings from Last 3 Encounters:  03/03/24 135 lb (61.2 kg)  12/15/23 130 lb (59 kg)  06/13/23 134 lb (60.8 kg)    There were no vitals taken for this visit.  Assessment and Plan:  Problem List Items Addressed This Visit   None   No follow-ups on file.    Leita HILARIO Adie, MD [facility] [place]

## 2024-04-30 NOTE — Telephone Encounter (Signed)
 Please review.  KP

## 2024-05-05 ENCOUNTER — Other Ambulatory Visit: Payer: Self-pay | Admitting: Internal Medicine

## 2024-05-05 ENCOUNTER — Encounter: Payer: Self-pay | Admitting: Internal Medicine

## 2024-05-05 DIAGNOSIS — I10 Essential (primary) hypertension: Secondary | ICD-10-CM

## 2024-05-06 DIAGNOSIS — Z0279 Encounter for issue of other medical certificate: Secondary | ICD-10-CM

## 2024-05-06 NOTE — Telephone Encounter (Signed)
 Requested by interface surescripts. Future visit 05/24/24.  Requested Prescriptions  Pending Prescriptions Disp Refills   olmesartan  (BENICAR ) 40 MG tablet [Pharmacy Med Name: OLMESARTAN  MEDOXOMIL 40MG  TABLETS] 90 tablet 0    Sig: TAKE 1 TABLET(40 MG) BY MOUTH DAILY     Cardiovascular:  Angiotensin Receptor Blockers Passed - 05/06/2024  3:47 PM      Passed - Cr in normal range and within 180 days    Creatinine, Ser  Date Value Ref Range Status  03/03/2024 0.89 0.57 - 1.00 mg/dL Final         Passed - K in normal range and within 180 days    Potassium  Date Value Ref Range Status  03/03/2024 4.0 3.5 - 5.2 mmol/L Final         Passed - Patient is not pregnant      Passed - Last BP in normal range    BP Readings from Last 1 Encounters:  03/03/24 118/74         Passed - Valid encounter within last 6 months    Recent Outpatient Visits           2 months ago Trigger middle finger of left hand   [facility] at [facility], Leita DEL, MD   4 months ago Annual physical exam   [facility] at [facility], Leita DEL, MD

## 2024-05-11 ENCOUNTER — Encounter: Payer: Self-pay | Admitting: Internal Medicine

## 2024-05-12 NOTE — Telephone Encounter (Signed)
 Please review.  KP

## 2024-05-13 NOTE — Telephone Encounter (Signed)
Completed and re faxed

## 2024-05-14 ENCOUNTER — Inpatient Hospital Stay: Admitting: Internal Medicine

## 2024-05-17 NOTE — ED Provider Notes (Signed)
 " [facility] EMERGENCY DEPARTMENT  ED Provider Note History   Chief Complaint  Patient presents with   Abnormal Lab   History of Present Illness April Fernandez is a 53 y.o. female who presents to the ED for evaluation of normal laboratory studies/concern for optic neuritis.  Patient was seen in the emergency room on 05/14/2024.  At that time ophthalmology evaluated patient, they requested an MRI to evaluate for optic neuritis.  Results of this were reviewed, the ultimately they were concerning for optic neuritis.  Patient was called by my associate to come to the emergency room for initiation of IV steroids.  Patient denies any significant change in vision from 07/16/2023.  Per ER notes, Dr. Arminda recommended admission for IV steroids.  Level of Interpreter Services: No interpreter needed (no language barrier) Past Medical History:  Diagnosis Date   Hormone deficiency    Hypertension    Smoker    History reviewed. No pertinent surgical history. Family History  Problem Relation Age of Onset   High blood pressure (Hypertension) Mother    Social History   Socioeconomic History   Marital status: Single  Tobacco Use   Smoking status: Every Day    Current packs/day: 0.50    Types: Cigarettes   Smokeless tobacco: Never  Vaping Use   Vaping status: Former  Substance and Sexual Activity   Alcohol use: Yes    Comment: socially   Drug use: Never   Sexual activity: Defer   Social Drivers of Corporate Investment Banker Strain: Low Risk  (05/11/2024)   Overall Financial Resource Strain (CARDIA)    Difficulty of Paying Living Expenses: Not hard at all  Food Insecurity: No Food Insecurity (05/11/2024)   Hunger Vital Sign    Worried About Running Out of Food in the Last Year: Never true    Ran Out of Food in the Last Year: Never true  Transportation Needs: No Transportation Needs (05/11/2024)   PRAPARE - Administrator, Civil Service (Medical): No    Lack  of Transportation (Non-Medical): No  Housing Stability: Low Risk  (05/08/2024)   Housing Stability Vital Sign    Unable to Pay for Housing in the Last Year: No    Number of Times Moved in the Last Year: 0    Homeless in the Last Year: No   Review of Systems: All other systems negative except as noted in HPI.   Physical Exam  BP 115/68 (BP Location: Left upper arm, Patient Position: Sitting)   Pulse 94   Temp 37.1 C (98.8 F) (Oral)   Resp 17   SpO2 100%  Physical Exam Vitals and nursing note reviewed.  Constitutional:      General: She is not in acute distress.    Appearance: Normal appearance. She is well-developed. She is not diaphoretic.  Cardiovascular:     Rate and Rhythm: Normal rate and regular rhythm.     Heart sounds: Normal heart sounds.  Pulmonary:     Effort: Pulmonary effort is normal.     Breath sounds: Normal breath sounds.  Abdominal:     General: Bowel sounds are normal.     Palpations: Abdomen is soft.  Musculoskeletal:        General: Normal range of motion.     Cervical back: Full passive range of motion without pain and neck supple. No pain with movement, spinous process tenderness or muscular tenderness.  Skin:    General: Skin is warm.  Capillary Refill: Capillary refill takes less than 2 seconds.  Neurological:     Mental Status: She is alert and oriented to person, place, and time.  Psychiatric:        Behavior: Behavior normal.      Procedures  Procedures TIP (no need to delete)  Delete the Procedures section if patient does not have a procedure.  Medical Decision Making and ED Course  Given the patient's initial exam and evaluation, the following diagnostic evaluation has been ordered. The patient will be placed in the appropriate treatment space, once one is available to complete the evaluation and treatment. I have discussed the plan of care with the patient/representative and have advised that the plan of care may change based upon  the results of diagnostic testing. The patient/representative has been asked to not leave prior to the completion of their evaluation. I have advised that if they leave prior to the completion of their evaluation, they will be leaving against medical advice.  53 year old female with history of MS and possible optic neuritis, patient was called back to the emergency room by my associate after having an MRI study of her orbits which showed likely bilateral optic neuritis.  Patient had previously been admitted to hospital between 12/12 and 12/15 for optic neuritis, patient received 3 days of IV steroids.  The second MRI study of the orbits was performed after that treatment.  Patient's vision is at or near her baseline, measured at 20/30 bilaterally.  Documents indicate this is very similar to her previous vision.  Initially reached out to [facility] covering ophthalmology, without a response to pages, then reached out to the [facility] who recommended all for covering ophthalmologist to evaluate patient for any optic nerve damage.  They would be happy to accept patient in transfer after ophthalmology evaluates patient and determine his need.  Patient is currently stable, has received a single bolus of methylprednisolone 250 mg.  Will hold additional steroids until ophthalmology is able to evaluate patient.  5:34 AM discussed case with ophthalmology on-call, Dr.McKinnon, who did not feel patient required additional admission, after reviewing the case he felt that patient is best served by following up with neurology which is currently scheduled.  Encourage patient to reach out to neurology and ophthalmology as needed.  Suspect patient will progressively improve, patient understands that should her vision deteriorate, she should likely follow-up with [facility] for additional care evaluation.  Medical Complexity:  [x] New and requires workup. [] New and does not require workup. [x] Pertinent  labs & imaging results were reviewed by me and considered in my decision making. [] I obtained history from someone other than the patient. [x] I reviewed previous medical records. [] I independently visualized image(s), tracing(s), and/or specimen(s). [x] I discussed the patient with another provider.     Results: LABS: Recent Results (from the past 24 hours)  Comprehensive Metabolic Panel (CMP)   Collection Time: 05/17/24  1:13 AM  Result Value Ref Range   Sodium 135 135 - 145 mmol/L   Potassium 3.6 3.5 - 5.0 mmol/L   Chloride 99 98 - 108 mmol/L   Carbon Dioxide (CO2) 28 21 - 30 mmol/L   Urea Nitrogen (BUN) 9 7 - 20 mg/dL   Creatinine 0.9 0.4 - 1.0 mg/dL   Glucose 899 70 - 859 mg/dL   Calcium 9.4 8.7 - 89.7 mg/dL   AST (Aspartate Aminotransferase) 14 (L) 15 - 41 U/L   ALT (Alanine Aminotransferase) 11 10 - 39 U/L   Bilirubin, Total  0.6 0.4 - 1.5 mg/dL   Alk Phos (Alkaline Phosphatase) 38 24 - 110 U/L   Albumin 3.4 (L) 3.5 - 4.8 g/dL   Protein, Total 6.2 6.2 - 8.1 g/dL   Anion Gap 8 3 - 12 mmol/L   BUN/CREA Ratio 10 6 - 27   Glomerular Filtration Rate (eGFR)  75 mL/min/1.73sq m  Complete Blood Count (CBC) with Differential   Collection Time: 05/17/24  1:13 AM  Result Value Ref Range   WBC (White Blood Cell Count) 7.3 3.2 - 9.8 x109/L   Hemoglobin 11.2 (L) 11.7 - 15.5 g/dL   Hematocrit 65.3 (L) 64.9 - 45.0 %   Platelets 338 150 - 450 x109/L   MCV (Mean Corpuscular Volume) 91 80 - 98 fL   MCH (Mean Corpuscular Hemoglobin) 29.3 26.5 - 34.0 pg   MCHC (Mean Corpuscular Hemoglobin Concentration) 32.4 31.0 - 36.0 %   RBC (Red Blood Cell Count) 3.82 3.77 - 5.16 x1012/L   RDW-CV (Red Cell Distribution Width) 12.5 11.5 - 14.5 %   NRBC (Nucleated Red Blood Cell Count) 0.00 0 x109/L   NRBC % (Nucleated Red Blood Cell %) 0.0 %   MPV (Mean Platelet Volume) 7.6 7.2 - 11.7 fL   Neutrophil Count 3.5 2.0 - 8.6 x109/L   Neutrophil % 47.8 37 - 80 %   Lymphocyte Count 3.0 0.6 - 4.2  x109/L   Lymphocyte % 41.7 10 - 50 %   Monocyte Count 0.7 0 - 0.9 x109/L   Monocyte % 9.2 0 - 12 %   Eosinophil Count 0.07 0 - 0.70 x109/L   Eosinophil % 1.0 0 - 7 %   Basophil Count 0.00 0 - 0.20 x109/L   Basophil % 0.0 0 - 2 %   Immature Granulocyte Count 0.02 <=0.06 x109/L   Immature Granulocyte % 0.3 <=0.7 %    IMAGING: Notified that Radiology will read radiologic studies and patient will be called if change in report by radiology department. No orders to display        ED Clinical Impression  1. Optic neuritis               This note was partially written using Dragon (a voice recognition system) and may contain typographical errors missed during proofreading. TIP (no need to delete)  Click Refresh button to add wrap-up information to note.    I personally evaluated and took responsibility for care of this patient. The physician supervisor was Israel Landa, MD who was available for consult and attested to or co-signed this chart. The service was performed non-incident to a physician.     Stuart Berg T, [place] 05/17/24 [phone]  "

## 2024-05-24 ENCOUNTER — Ambulatory Visit: Admitting: Internal Medicine

## 2024-05-24 ENCOUNTER — Encounter: Payer: Self-pay | Admitting: Internal Medicine

## 2024-05-24 VITALS — BP 116/68 | HR 88 | Ht 62.0 in | Wt 132.0 lb

## 2024-05-24 DIAGNOSIS — G35A Relapsing-remitting multiple sclerosis: Secondary | ICD-10-CM

## 2024-05-24 DIAGNOSIS — K047 Periapical abscess without sinus: Secondary | ICD-10-CM | POA: Diagnosis not present

## 2024-05-24 DIAGNOSIS — M51362 Other intervertebral disc degeneration, lumbar region with discogenic back pain and lower extremity pain: Secondary | ICD-10-CM

## 2024-05-24 DIAGNOSIS — I10 Essential (primary) hypertension: Secondary | ICD-10-CM | POA: Diagnosis not present

## 2024-05-24 DIAGNOSIS — M5126 Other intervertebral disc displacement, lumbar region: Secondary | ICD-10-CM | POA: Diagnosis not present

## 2024-05-24 DIAGNOSIS — Z1211 Encounter for screening for malignant neoplasm of colon: Secondary | ICD-10-CM | POA: Diagnosis not present

## 2024-05-24 MED ORDER — TRAMADOL-ACETAMINOPHEN 37.5-325 MG PO TABS: 1.0000 | ORAL_TABLET | Freq: Four times a day (QID) | ORAL | 1 refills | Status: AC | PRN

## 2024-05-24 MED ORDER — AMOXICILLIN-POT CLAVULANATE 875-125 MG PO TABS: 1.0000 | ORAL_TABLET | Freq: Two times a day (BID) | ORAL | 0 refills | Status: AC

## 2024-05-24 NOTE — Assessment & Plan Note (Signed)
 Recent solumedrol infusion in the ER Has Neurology appointment this week.

## 2024-05-24 NOTE — Progress Notes (Signed)
 "   Date:  05/24/2024   Name:  April Fernandez   DOB:  1970-11-04   MRN:  [id number]   Chief Complaint: Hypertension  Hypertension This is a chronic problem. The problem is controlled. Pertinent negatives include no chest pain, headaches, palpitations or shortness of breath. Past treatments include angiotensin blockers and calcium channel blockers. The current treatment provides significant improvement.  Back Pain Pertinent negatives include no abdominal pain, chest pain, headaches or weakness.  Dental Pain   Eye Problem  The left eye is affected. This is a recurrent problem. The current episode started 1 to 4 weeks ago. The problem occurs constantly. The problem has been waxing and waning. Injury mechanism: Atributed to flare of MS. The patient is experiencing no pain. Pertinent negatives include no weakness.    Review of Systems  Constitutional:  Negative for chills, fatigue and unexpected weight change.  HENT:  Positive for dental problem. Negative for trouble swallowing.   Eyes:  Positive for visual disturbance.  Respiratory:  Negative for cough, chest tightness, shortness of breath and wheezing.   Cardiovascular:  Negative for chest pain, palpitations and leg swelling.  Gastrointestinal:  Negative for abdominal pain, constipation and diarrhea.  Musculoskeletal:  Positive for back pain. Negative for arthralgias and myalgias.  Neurological:  Negative for dizziness, weakness, light-headedness and headaches.  Psychiatric/Behavioral:  Negative for dysphoric mood and sleep disturbance. The patient is not nervous/anxious.      Lab Results  Component Value Date   NA 136 03/03/2024   K 4.0 03/03/2024   CO2 22 03/03/2024   GLUCOSE 85 03/03/2024   BUN 8 03/03/2024   CREATININE 0.89 03/03/2024   CALCIUM 9.9 03/03/2024   EGFR 77 03/03/2024   GFRNONAA 73 05/27/2019   Lab Results  Component Value Date   CHOL 228 (H) 03/03/2024   HDL 55 03/03/2024   LDLCALC 159 (H) 03/03/2024    TRIG 80 03/03/2024   CHOLHDL 4.1 03/03/2024   Lab Results  Component Value Date   TSH 1.050 03/03/2024   Lab Results  Component Value Date   HGBA1C 5.6 03/03/2024   Lab Results  Component Value Date   WBC 6.9 03/03/2024   HGB 12.3 03/03/2024   HCT 38.8 03/03/2024   MCV 94 03/03/2024   PLT 333 03/03/2024   Lab Results  Component Value Date   ALT 9 03/03/2024   AST 12 03/03/2024   ALKPHOS 59 03/03/2024   BILITOT 0.5 03/03/2024   No results found for: MARIEN BOLLS, VD25OH   Patient Active Problem List   Diagnosis Date Noted   Acute relapsing multiple sclerosis 05/08/2024   Sciatica 05/07/2024   Displacement of lumbar intervertebral disc without myelopathy 05/07/2024   Calculus of gallbladder without cholecystitis without obstruction 06/13/2023   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   Ventral hernia with bowel obstruction 03/04/2022   LGSIL on Pap smear of cervix 10/08/2021   Chronic SI joint pain 11/03/2020   Greater trochanteric bursitis of left hip 11/03/2020   Osteoarthritis of spine with radiculopathy, lumbar region 11/03/2020   Mixed hyperlipidemia 05/28/2019   Enthesopathy of hip region 05/27/2019   Fibromyositis 05/27/2019   Trigger middle finger of left hand 05/05/2015   Essential hypertension 01/09/2015   Degeneration of intervertebral disc of lumbar region with discogenic back pain and lower extremity pain 01/09/2015   Allergic rhinitis 01/09/2015   Arthritis of hand 01/09/2015   Tobacco use disorder 01/09/2015   Arthralgia of hip or thigh 08/18/2009   Awareness of heartbeats 08/18/2009  Allergies[1]  Past Surgical History:  Procedure Laterality Date   lumbar ESI  2007    Social History[2]   Medication list has been reviewed and updated.  Active Medications[3]     05/24/2024    9:51 AM 03/03/2024    9:16 AM 12/15/2023    9:36 AM 06/13/2023    9:52 AM  GAD 7 : Generalized Anxiety Score  Nervous, Anxious, on Edge 0 0 0 0   Control/stop worrying 0 0 0 0  Worry too much - different things  0 0 0  Trouble relaxing  0 0 0  Restless  0 0 0  Easily annoyed or irritable  0 0 0  Afraid - awful might happen  0 0 0  Total GAD 7 Score  0 0 0  Anxiety Difficulty  Not difficult at all Not difficult at all Not difficult at all       05/24/2024    9:51 AM 03/03/2024    9:16 AM 12/15/2023    9:36 AM  Depression screen PHQ 2/9  Decreased Interest 0 0 0  Down, Depressed, Hopeless 0 0 0  PHQ - 2 Score 0 0 0  Altered sleeping  0 1  Tired, decreased energy  0 1  Change in appetite  0 0  Feeling bad or failure about yourself   0 0  Trouble concentrating  0 0  Moving slowly or fidgety/restless  0 0  Suicidal thoughts  0   PHQ-9 Score  0  2   Difficult doing work/chores  Not difficult at all Not difficult at all     Data saved with a previous flowsheet row definition    BP Readings from Last 3 Encounters:  05/24/24 116/68  03/03/24 118/74  12/15/23 124/78    Physical Exam Vitals and nursing note reviewed.  Constitutional:      General: She is not in acute distress.    Appearance: Normal appearance. She is well-developed.  HENT:     Head: Normocephalic and atraumatic.     Mouth/Throat:     Mouth: Mucous membranes are moist.     Comments: Chipped tooth lower left Neck:     Vascular: No carotid bruit.  Cardiovascular:     Rate and Rhythm: Normal rate and regular rhythm.     Heart sounds: No murmur heard. Pulmonary:     Effort: Pulmonary effort is normal. No respiratory distress.     Breath sounds: No wheezing or rhonchi.  Musculoskeletal:     Cervical back: Normal range of motion.     Right lower leg: No edema.     Left lower leg: No edema.  Lymphadenopathy:     Cervical: No cervical adenopathy.  Skin:    General: Skin is warm and dry.     Findings: No rash.  Neurological:     Mental Status: She is alert and oriented to person, place, and time.  Psychiatric:        Mood and Affect: Mood normal.         Behavior: Behavior normal.     Wt Readings from Last 3 Encounters:  05/24/24 132 lb (59.9 kg)  03/03/24 135 lb (61.2 kg)  12/15/23 130 lb (59 kg)    BP 116/68   Pulse 88   Ht 5' 2 (1.575 m)   Wt 132 lb (59.9 kg)   LMP 04/26/2024 (Approximate)   SpO2 98%   BMI 24.14 kg/m   Assessment and Plan:  Problem List Items  Addressed This Visit       Unprioritized   Essential hypertension - Primary (Chronic)   Well controlled blood pressure today. Current regimen is olmesartan  and amlodipine . No medication side effects noted.        Degeneration of intervertebral disc of lumbar region with discogenic back pain and lower extremity pain   Continues on Tramadol  with good benefit. No evidence of misuse or abuse      Relevant Medications   traMADol -acetaminophen  (ULTRACET ) 37.5-325 MG tablet   Displacement of lumbar intervertebral disc without myelopathy   Relevant Medications   traMADol -acetaminophen  (ULTRACET ) 37.5-325 MG tablet   Acute relapsing multiple sclerosis   Recent solumedrol infusion in the ER Has Neurology appointment this week.      Other Visit Diagnoses       Colon cancer screening       Relevant Orders   Cologuard     Dental infection       Relevant Medications   amoxicillin -clavulanate (AUGMENTIN) 875-125 MG tablet      Recommend she establish with [facility] nearer to home. No follow-ups on file.    Leita HILARIO Adie, MD [facility] [place]           [1]  Allergies Allergen Reactions   Fish Protein-Containing Drug Products Hives and Swelling   Other Hives    Tree Nuts   Shellfish Allergy Hives   Fish Allergy Other (See Comments)   Peanut-Containing Drug Products Other (See Comments)   Hydrochlorothiazide Palpitations  [2]  Social History Tobacco Use   Smoking status: Every Day    Current packs/day: 0.50    Average packs/day: 0.5 packs/day for 39.0 years (19.5 ttl pk-yrs)     Types: Cigarettes    Start date: 1987   Smokeless tobacco: Never  Vaping Use   Vaping status: Former   Quit date: 05/27/2016  Substance Use Topics   Alcohol use: Not Currently   Drug use: Never  [3]  Current Meds  Medication Sig   amLODipine  (NORVASC ) 10 MG tablet Take 1 tablet (10 mg total) by mouth daily.   amoxicillin -clavulanate (AUGMENTIN) 875-125 MG tablet Take 1 tablet by mouth 2 (two) times daily for 10 days.   cyclobenzaprine  (FLEXERIL ) 5 MG tablet Take 1 tablet (5 mg total) by mouth at bedtime as needed for muscle spasms.   meloxicam  (MOBIC ) 15 MG tablet Take 1 tablet (15 mg total) by mouth daily.   olmesartan  (BENICAR ) 40 MG tablet TAKE 1 TABLET(40 MG) BY MOUTH DAILY   VIENVA 0.1-20 MG-MCG tablet TAKE 1 TABLET BY MOUTH DAILY   Vitamin D, Ergocalciferol, (DRISDOL) 1.25 MG (50000 UNIT) CAPS capsule Take 50,000 Units by mouth every 7 (seven) days.   [DISCONTINUED] traMADol -acetaminophen  (ULTRACET ) 37.5-325 MG tablet Take 1-2 tablets by mouth every 6 (six) hours as needed.   "

## 2024-05-24 NOTE — Assessment & Plan Note (Signed)
 Continues on Tramadol  with good benefit. No evidence of misuse or abuse

## 2024-05-24 NOTE — Assessment & Plan Note (Signed)
 Well controlled blood pressure today. Current regimen is olmesartan and amlodipine . No medication side effects noted.
